# Patient Record
Sex: Male | Born: 1960
Health system: Southern US, Community
[De-identification: ages and names within clinical notes are randomized; demographics above are authoritative.]

## PROBLEM LIST (undated history)

## (undated) DIAGNOSIS — G4733 Obstructive sleep apnea (adult) (pediatric): Secondary | ICD-10-CM

## (undated) DIAGNOSIS — K219 Gastro-esophageal reflux disease without esophagitis: Secondary | ICD-10-CM

## (undated) DIAGNOSIS — M549 Dorsalgia, unspecified: Secondary | ICD-10-CM

## (undated) DIAGNOSIS — R1310 Dysphagia, unspecified: Secondary | ICD-10-CM

## (undated) HISTORY — DX: Dysphagia, unspecified: R13.10

## (undated) HISTORY — DX: Dorsalgia, unspecified: M54.9

## (undated) HISTORY — DX: Gastro-esophageal reflux disease without esophagitis: K21.9

## (undated) HISTORY — DX: Obstructive sleep apnea (adult) (pediatric): G47.33

---

## 2002-07-29 ENCOUNTER — Encounter: Payer: Self-pay | Admitting: Internal Medicine

## 2002-07-29 ENCOUNTER — Ambulatory Visit (HOSPITAL_COMMUNITY): Admission: RE | Admit: 2002-07-29 | Discharge: 2002-07-29 | Payer: Self-pay | Admitting: Internal Medicine

## 2002-08-22 ENCOUNTER — Ambulatory Visit (HOSPITAL_COMMUNITY): Admission: RE | Admit: 2002-08-22 | Discharge: 2002-08-22 | Payer: Self-pay | Admitting: Internal Medicine

## 2008-10-23 ENCOUNTER — Emergency Department (HOSPITAL_COMMUNITY): Admission: EM | Admit: 2008-10-23 | Discharge: 2008-10-23 | Payer: Self-pay | Admitting: Emergency Medicine
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
## 2011-01-30 LAB — POCT I-STAT, CHEM 8
Calcium, Ion: 1.1 mmol/L — ABNORMAL LOW (ref 1.12–1.32)
Chloride: 101 mEq/L (ref 96–112)
Creatinine, Ser: 1.4 mg/dL (ref 0.4–1.5)
Glucose, Bld: 126 mg/dL — ABNORMAL HIGH (ref 70–99)
HCT: 49 % (ref 39.0–52.0)
Hemoglobin: 16.7 g/dL (ref 13.0–17.0)
Potassium: 3.8 mEq/L (ref 3.5–5.1)

## 2011-01-30 LAB — CBC
Hemoglobin: 16.1 g/dL (ref 13.0–17.0)
MCHC: 34.2 g/dL (ref 30.0–36.0)
MCV: 95 fL (ref 78.0–100.0)
RBC: 4.97 MIL/uL (ref 4.22–5.81)
WBC: 11.8 10*3/uL — ABNORMAL HIGH (ref 4.0–10.5)

## 2011-01-30 LAB — DIFFERENTIAL
Basophils Relative: 0 % (ref 0–1)
Eosinophils Absolute: 0 10*3/uL (ref 0.0–0.7)
Lymphs Abs: 0.7 10*3/uL (ref 0.7–4.0)
Monocytes Absolute: 0.5 10*3/uL (ref 0.1–1.0)
Monocytes Relative: 5 % (ref 3–12)

## 2011-03-03 NOTE — Op Note (Signed)
NAME:  Dalton Bradley, Dalton Bradley                            ACCOUNT NO.:  [id number]   MEDICAL RECORD NO.:  [id number]                   PATIENT TYPE:  AMB   LOCATION:  DAY                                  FACILITY:  [facility]   PHYSICIAN:  R. Roetta Sessions, M.D.              DATE OF BIRTH:  28-Feb-1961   DATE OF PROCEDURE:  08/22/2002  DATE OF DISCHARGE:                                 OPERATIVE REPORT   PROCEDURE:  Diagnostic esophagogastroduodenoscopy.   ENDOSCOPIST:  Gerrit Friends. Rourk, M.D.   INDICATIONS FOR PROCEDURE:  The patient is a 50 year old gentleman with a 6-  month history of vague upper abdominal pain. He has some reflux symptoms. He  drinks carbonated/caffeinated beverages in excess daily. He has isolated,  elevated, STPT.  EGD is now being done to further evaluate his symptoms.  The approach has been discussed with the patient at length.  The potential  risks, benefits, and alternatives have been reviewed.  It is notable that  his CBC and amylase are normal.  Prior right upper quadrant ultrasound  demonstrated no hepatobiliary abnormalities. This approach has been  discussed with the patient previously. The potential risks, benefits, and  alternatives have been reviewed, questions answered. ASA I.   DESCRIPTION OF PROCEDURE:  O2 saturation, blood pressure, pulse and  respirations were monitored throughout the entire procedure.   CONSCIOUS SEDATION:  Versed 4 mg IV, Demerol 100 mg IV in divided doses,  Cetacaine spray for topical oropharyngeal anesthesia.   INSTRUMENT:  Olympus video chip adult gastroscope.   FINDINGS:  Examination of the tubular esophagus revealed multiple, deep,  distal linear esophageal erosions, or least 5 coming up from the  esophagogastric junction.  Please see photos.  There is no Barrett  esophagus.  No evidence of neoplasm.  There is a noncritical Schatzki's  ring.  The EG junction was easily traversed.   STOMACH:  The gastric cavity was empty.  It  insufflated well with air.  A  thorough examination of the gastric mucosa including a retroflex view of the  proximal stomach and esophagogastric junction demonstrated a small hiatal  hernia only.  The pylorus was patent and easily traversed.   DUODENUM:  The bulb and the second portion appeared normal.   THERAPY/DIAGNOSTIC MANEUVERS:  None.   The patient tolerated the procedure well and was reacted in endoscopy.   IMPRESSION:  1. Multiple, deep, distal esophageal erosions consistent with a moderately     severe erosive reflux esophagitis.  2. Small hiatal hernia.  3. The remainder of the upper gastrointestinal tract through the second     portion fo the duodenum appeared normal.   DISCUSSION:  This patient has a significant gastroesophageal reflux disease.   RECOMMENDATIONS:  1. Antireflux measures emphasized. He is to cut back dramatically on his     carbonated/caffeinated beverages.  2. Literature for gastroesophageal reflux provided to the patient.  3. Begin Aciphex 200 mg orally daily b.i.d. for the next 2 weeks (before     breakfast and supper) and then drop back to once daily before breakfast.     Follow up with me in 6 weeks.  4. Will repeat his liver functions just prior to his next office visit.                                               Jonathon Bellows, M.D.    RMR/MEDQ  D:  08/22/2002  T:  08/22/2002  Job:  [id number]   cc:   Kingsley Callander. Ouida Sills, M.D.  [address]  [place]  [place] [address]  Fax: [phone]

## 2011-03-03 NOTE — Consult Note (Signed)
NAMELAYNE, DILAURO                              ACCOUNT NO.:  [id number]   MEDICAL RECORD NO.:  [id number]                  PATIENT TYPE:   LOCATION:                                       FACILITY:   PHYSICIAN:  R. Roetta Sessions, M.D.              DATE OF BIRTH:  1961-06-17   DATE OF CONSULTATION:  DATE OF DISCHARGE:                                   CONSULTATION   REASON FOR CONSULTATION:  Elevated transaminases and upper abdominal pain.   HISTORY OF PRESENT ILLNESS:  This patient is a 50 year old Caucasian male  sent over by Dr. Ouida Sills to further evaluate a 86-month history of vague upper  abdominal pain.  He describes gnawing, burning epigastric and right upper  quadrant pain from time to time, not associated with eating or other  activities.  It comes and goes spontaneously. It may last a half hour to a  couple of hours.  There is nothing that he can do to incite it or ameliorate  these symptoms.  He does have some frequent background symptoms of heartburn  and also complains of a lump phlegm in the back of his throat which he  constantly has to attempt to clear.  He does not have hoarseness.  He does  take Advil fairly regularly for various aches and pains.  He has not had any  melena, hematochezia, no change in bowel habits.  No diarrhea or  constipation.  He moves his bowels at least once daily.  He has not had any  change in weight.  He does drink upwards of five to six 16-ounce carbonated  soft drinks daily.  Recent laboratory evaluation from 06/25/02 included a  liver profile which demonstrated elevated SGPT of 45, everything else was  normal.  Right upper quadrant ultrasound demonstrated no hepatobiliary  abnormalities.  He is not taking any herbal or other kind of remedies. There  is no family history of liver disease or cancer.  He does not consume any  alcohol.  No history of yellow jaundice or hepatitis previously.   PAST MEDICAL HISTORY:  Significant for no chronic  illnesses.   PAST SURGICAL HISTORY:  Past surgeries none.   CURRENT MEDICATIONS:  None.   ALLERGIES:  No known drug allergies.   FAMILY HISTORY:  The family reportedly in good health.  No history of  chronic GI or liver disease.   SOCIAL HISTORY:  The patient has been married for 3 months and has 2  children.  He is employed with [employer].  He describes this as being a  very stressful job.  No tobacco or alcohol.   REVIEW OF SYSTEMS:  No chest pain, no dyspnea.  No fever or chills.  No  change in weight.   PHYSICAL EXAMINATION:  GENERAL:  Reveals a pleasant, 50 year old gentleman  resting comfortably.  VITAL SIGNS:  Weight 231, height 6 feet 4.  Temperature 97.6, BP 110/80,  pulse 60.  SKIN:  Warm and dry.  No jaundice.  No cutaneous stigmata of chronic liver  disease.  HEENT:  No scleral icterus.  Conjunctivae are pink.  Oral cavity no lesions.  CHEST:  Lungs are clear to auscultation.  CARDIAC:  Regular rate and rhythm without murmur, gallop, or rub.  ABDOMEN:  Nondistended, positive bowel sounds.  Soft, nontender, without  appreciable mass or organomegaly.  EXTREMITIES:  No edema.   IMPRESSION:  This patient is a pleasant, 50 year old gentleman with a 6-  month history of vague upper abdominal pain which really falls into the  relatively nebulous category of dyspepsia.  He does take nonsteroidals.  He  has had GERD.  He does take carbonated/caffeinated beverages in excess  daily. His throat symptoms may or may not be related to any GI process.  He  has minimally elevated transaminases, normal right upper quadrant  ultrasound.   RECOMMENDATIONS:  We will proceed with an EGD to further evaluate his  abdominal pain.  I have discussed this approach with the patient.  The  potential risks, benefits, and alternatives have been reviewed, questions  answered.  I feel that is low risk, ASA I.   It has been a month since his LFTs were checked and we will go ahead and   plan to check a CBC, amylase, and repeat LFTs in the near future.  Further  recommendations to follow.   I would like to thank Dr. Ouida Sills for allowing me to see this nice gentleman  today.                                               Jonathon Bellows, M.D.    RMR/MEDQ  D:  08/13/2002  T:  08/14/2002  Job:  [id number]   cc:   Kingsley Callander. Ouida Sills, M.D.  [address]  [place]  [place] [address]  Fax: [phone]

## 2011-05-03 ENCOUNTER — Encounter: Payer: Self-pay | Admitting: Family Medicine

## 2014-06-09 ENCOUNTER — Encounter: Payer: Self-pay | Admitting: *Deleted

## 2014-07-03 ENCOUNTER — Ambulatory Visit (INDEPENDENT_AMBULATORY_CARE_PROVIDER_SITE_OTHER): Payer: 59 | Admitting: Family Medicine

## 2014-07-03 ENCOUNTER — Encounter: Payer: Self-pay | Admitting: Family Medicine

## 2014-07-03 VITALS — BP 110/72 | HR 74 | Temp 98.2°F | Resp 18 | Ht 75.0 in | Wt 234.0 lb

## 2014-07-03 DIAGNOSIS — Z0000 Encounter for general adult medical examination without abnormal findings: Secondary | ICD-10-CM
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
## 2014-07-06 ENCOUNTER — Other Ambulatory Visit: Payer: Self-pay | Admitting: Family Medicine

## 2014-07-06 DIAGNOSIS — Z1211 Encounter for screening for malignant neoplasm of colon: Secondary | ICD-10-CM

## 2014-07-07 ENCOUNTER — Other Ambulatory Visit: Payer: 59

## 2014-07-07 ENCOUNTER — Encounter: Payer: Self-pay | Admitting: Family Medicine

## 2014-07-07 DIAGNOSIS — Z0000 Encounter for general adult medical examination without abnormal findings: Secondary | ICD-10-CM

## 2014-07-07 LAB — COMPLETE METABOLIC PANEL WITH GFR
ALT: 33 U/L (ref 0–53)
AST: 22 U/L (ref 0–37)
Albumin: 4.4 g/dL (ref 3.5–5.2)
Alkaline Phosphatase: 89 U/L (ref 39–117)
BUN: 13 mg/dL (ref 6–23)
CALCIUM: 9.4 mg/dL (ref 8.4–10.5)
CHLORIDE: 102 meq/L (ref 96–112)
CO2: 30 meq/L (ref 19–32)
Creat: 1.09 mg/dL (ref 0.50–1.35)
GFR, EST AFRICAN AMERICAN: 89 mL/min
GFR, Est Non African American: 77 mL/min
Glucose, Bld: 92 mg/dL (ref 70–99)
POTASSIUM: 4.3 meq/L (ref 3.5–5.3)
SODIUM: 139 meq/L (ref 135–145)
TOTAL PROTEIN: 7.3 g/dL (ref 6.0–8.3)
Total Bilirubin: 0.7 mg/dL (ref 0.2–1.2)

## 2014-07-07 LAB — LIPID PANEL
Cholesterol: 168 mg/dL (ref 0–200)
HDL: 44 mg/dL (ref >39)
LDL CALC: 109 mg/dL — AB (ref 0–99)
Total CHOL/HDL Ratio: 3.8 Ratio
Triglycerides: 73 mg/dL (ref <150)
VLDL: 15 mg/dL (ref 0–40)

## 2014-07-07 LAB — URINALYSIS, ROUTINE W REFLEX MICROSCOPIC
Bilirubin Urine: NEGATIVE
Glucose, UA: NEGATIVE mg/dL
Hgb urine dipstick: NEGATIVE
KETONES UR: NEGATIVE mg/dL
Leukocytes, UA: NEGATIVE
NITRITE: NEGATIVE
PROTEIN: NEGATIVE mg/dL
SPECIFIC GRAVITY, URINE: 1.027 (ref 1.005–1.030)
UROBILINOGEN UA: 1 mg/dL (ref 0.0–1.0)
pH: 6.5 (ref 5.0–8.0)

## 2014-07-07 LAB — CBC WITH DIFFERENTIAL/PLATELET
Basophils Absolute: 0 10*3/uL (ref 0.0–0.1)
Basophils Relative: 1 % (ref 0–1)
Eosinophils Absolute: 0.2 10*3/uL (ref 0.0–0.7)
Eosinophils Relative: 4 % (ref 0–5)
HCT: 46.4 % (ref 39.0–52.0)
Hemoglobin: 15.6 g/dL (ref 13.0–17.0)
LYMPHS ABS: 1.1 10*3/uL (ref 0.7–4.0)
LYMPHS PCT: 24 % (ref 12–46)
MCH: 31.1 pg (ref 26.0–34.0)
MCHC: 33.6 g/dL (ref 30.0–36.0)
MCV: 92.6 fL (ref 78.0–100.0)
Monocytes Absolute: 0.6 10*3/uL (ref 0.1–1.0)
Monocytes Relative: 13 % — ABNORMAL HIGH (ref 3–12)
NEUTROS PCT: 58 % (ref 43–77)
Neutro Abs: 2.6 10*3/uL (ref 1.7–7.7)
PLATELETS: 186 10*3/uL (ref 150–400)
RBC: 5.01 MIL/uL (ref 4.22–5.81)
RDW: 13.4 % (ref 11.5–15.5)
WBC: 4.4 10*3/uL (ref 4.0–10.5)

## 2014-07-08 LAB — PSA: PSA: 0.9 ng/mL (ref <=4.00)

## 2014-07-10 ENCOUNTER — Telehealth: Payer: Self-pay | Admitting: Family Medicine

## 2014-07-10 DIAGNOSIS — Z1211 Encounter for screening for malignant neoplasm of colon: Secondary | ICD-10-CM

## 2014-07-10 NOTE — Telephone Encounter (Signed)
Pt called with lab results.  Referral for GI put in

## 2014-07-10 NOTE — Telephone Encounter (Signed)
Patient is calling to get lab results  [phone]

## 2014-07-10 NOTE — Telephone Encounter (Signed)
Message copied by Donne Anon on Fri Jul 10, 2014 12:38 PM ------      Message from: Lynnea Ferrier      Created: Thu Jul 09, 2014  7:25 AM       Labs are excellent.  LDL is 109 (good).  There is NO blood in his urine.  I would recommend colonosocpy. ------

## 2015-03-25 ENCOUNTER — Ambulatory Visit (INDEPENDENT_AMBULATORY_CARE_PROVIDER_SITE_OTHER): Payer: 59 | Admitting: Family Medicine

## 2015-03-25 ENCOUNTER — Encounter: Payer: Self-pay | Admitting: Family Medicine

## 2015-03-25 VITALS — BP 140/72 | HR 82 | Temp 98.4°F | Resp 18 | Ht 75.0 in | Wt 246.0 lb

## 2015-03-25 DIAGNOSIS — M5442 Lumbago with sciatica, left side: Secondary | ICD-10-CM

## 2015-03-25 LAB — URINALYSIS, ROUTINE W REFLEX MICROSCOPIC
Bilirubin Urine: NEGATIVE
Glucose, UA: NEGATIVE mg/dL
Hgb urine dipstick: NEGATIVE
KETONES UR: NEGATIVE mg/dL
Leukocytes, UA: NEGATIVE
Nitrite: NEGATIVE
PH: 6 (ref 5.0–8.0)
PROTEIN: NEGATIVE mg/dL
Specific Gravity, Urine: 1.01 (ref 1.005–1.030)
UROBILINOGEN UA: 1 mg/dL (ref 0.0–1.0)

## 2015-03-25 MED ORDER — CYCLOBENZAPRINE HCL 10 MG PO TABS: 10.0000 mg | ORAL_TABLET | Freq: Three times a day (TID) | ORAL | Status: DC | PRN

## 2015-03-25 MED ORDER — PREDNISONE 20 MG PO TABS: ORAL_TABLET | ORAL | Status: DC
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
## 2015-11-30 ENCOUNTER — Encounter: Payer: Self-pay | Admitting: Family Medicine

## 2017-03-27 ENCOUNTER — Ambulatory Visit (INDEPENDENT_AMBULATORY_CARE_PROVIDER_SITE_OTHER): Payer: 59 | Admitting: Family Medicine

## 2017-03-27 ENCOUNTER — Encounter: Payer: Self-pay | Admitting: Family Medicine

## 2017-03-27 VITALS — BP 112/78 | HR 62 | Temp 98.0°F | Resp 16 | Ht 75.0 in | Wt 249.0 lb

## 2017-03-27 DIAGNOSIS — F419 Anxiety disorder, unspecified: Secondary | ICD-10-CM | POA: Diagnosis not present

## 2017-03-27 DIAGNOSIS — R0681 Apnea, not elsewhere classified: Secondary | ICD-10-CM

## 2017-03-27 DIAGNOSIS — J019 Acute sinusitis, unspecified: Secondary | ICD-10-CM | POA: Diagnosis not present

## 2017-03-27 DIAGNOSIS — H6982 Other specified disorders of Eustachian tube, left ear: Secondary | ICD-10-CM | POA: Diagnosis not present

## 2017-03-27 DIAGNOSIS — M545 Low back pain, unspecified: Secondary | ICD-10-CM

## 2017-03-27 MED ORDER — NAPROXEN 500 MG PO TABS: 500.0000 mg | ORAL_TABLET | Freq: Two times a day (BID) | ORAL | 0 refills | Status: DC | PRN

## 2017-03-27 MED ORDER — FLUTICASONE PROPIONATE 50 MCG/ACT NA SUSP: 2.0000 | Freq: Every day | NASAL | 6 refills | Status: DC

## 2017-03-27 MED ORDER — ALPRAZOLAM 0.5 MG PO TABS: 0.5000 mg | ORAL_TABLET | Freq: Three times a day (TID) | ORAL | 0 refills | Status: DC | PRN
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
## 2017-04-24 ENCOUNTER — Encounter: Payer: Self-pay | Admitting: Family Medicine

## 2017-04-24 ENCOUNTER — Ambulatory Visit (INDEPENDENT_AMBULATORY_CARE_PROVIDER_SITE_OTHER): Payer: 59 | Admitting: Family Medicine

## 2017-04-24 VITALS — BP 140/82 | HR 78 | Temp 97.7°F | Resp 18 | Ht 75.0 in | Wt 252.0 lb

## 2017-04-24 DIAGNOSIS — R1310 Dysphagia, unspecified: Secondary | ICD-10-CM

## 2017-04-24 DIAGNOSIS — F419 Anxiety disorder, unspecified: Secondary | ICD-10-CM | POA: Diagnosis not present

## 2017-04-24 DIAGNOSIS — R09A2 Foreign body sensation, throat: Secondary | ICD-10-CM

## 2017-04-24 DIAGNOSIS — F458 Other somatoform disorders: Secondary | ICD-10-CM

## 2017-04-24 DIAGNOSIS — R0989 Other specified symptoms and signs involving the circulatory and respiratory systems: Secondary | ICD-10-CM

## 2017-04-24 MED ORDER — ALPRAZOLAM 0.5 MG PO TABS: 0.5000 mg | ORAL_TABLET | Freq: Three times a day (TID) | ORAL | 0 refills | Status: DC | PRN
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
## 2017-04-27 ENCOUNTER — Telehealth: Payer: Self-pay | Admitting: *Deleted

## 2017-04-27 ENCOUNTER — Encounter: Payer: Self-pay | Admitting: Family Medicine

## 2017-04-27 NOTE — Telephone Encounter (Signed)
Received fax from patient for clarification of FMLA forms. Per fax from [place], HR with [employer], the initial certification does not indicate what the treatment plan was for the condition, the care provider does not indicate that the patient was under the care of MD during the period of absence. This can be completed on letterhead and faxed back to HR.  Call placed to patient for more information.   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 Patient is requesting block for above date, and intermittent going forward for pain. Patient brought in original forms and provider completed in office. No copy noted in chart.   Verbalized that fee may be charged and is per provider prerogative.   Forms routed to provider.

## 2017-04-27 NOTE — Telephone Encounter (Signed)
Letter transcribed and faxed to [employer] HR.

## 2017-05-02 ENCOUNTER — Encounter (INDEPENDENT_AMBULATORY_CARE_PROVIDER_SITE_OTHER): Payer: Self-pay | Admitting: Internal Medicine

## 2017-05-14 ENCOUNTER — Ambulatory Visit (INDEPENDENT_AMBULATORY_CARE_PROVIDER_SITE_OTHER): Payer: 59 | Admitting: Neurology

## 2017-05-14 ENCOUNTER — Encounter: Payer: Self-pay | Admitting: Neurology

## 2017-05-14 VITALS — BP 146/90 | HR 65 | Ht 75.0 in | Wt 253.0 lb

## 2017-05-14 DIAGNOSIS — F419 Anxiety disorder, unspecified: Secondary | ICD-10-CM

## 2017-05-14 DIAGNOSIS — R351 Nocturia: Secondary | ICD-10-CM | POA: Diagnosis not present

## 2017-05-14 DIAGNOSIS — R0681 Apnea, not elsewhere classified: Secondary | ICD-10-CM

## 2017-05-14 DIAGNOSIS — R609 Edema, unspecified: Secondary | ICD-10-CM | POA: Diagnosis not present

## 2017-05-14 DIAGNOSIS — G4726 Circadian rhythm sleep disorder, shift work type: Secondary | ICD-10-CM

## 2017-05-14 DIAGNOSIS — R0683 Snoring: Secondary | ICD-10-CM | POA: Diagnosis not present

## 2017-05-14 DIAGNOSIS — K219 Gastro-esophageal reflux disease without esophagitis: Secondary | ICD-10-CM

## 2017-05-14 DIAGNOSIS — G2581 Restless legs syndrome: Secondary | ICD-10-CM | POA: Diagnosis not present

## 2017-05-14 DIAGNOSIS — G4761 Periodic limb movement disorder: Secondary | ICD-10-CM

## 2017-05-14 DIAGNOSIS — E669 Obesity, unspecified: Secondary | ICD-10-CM
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
## 2017-05-14 NOTE — Patient Instructions (Signed)

## 2017-05-15 ENCOUNTER — Ambulatory Visit (INDEPENDENT_AMBULATORY_CARE_PROVIDER_SITE_OTHER): Payer: Self-pay | Admitting: Internal Medicine

## 2017-05-31 ENCOUNTER — Encounter (INDEPENDENT_AMBULATORY_CARE_PROVIDER_SITE_OTHER): Payer: Self-pay | Admitting: Internal Medicine

## 2017-05-31 ENCOUNTER — Other Ambulatory Visit (INDEPENDENT_AMBULATORY_CARE_PROVIDER_SITE_OTHER): Payer: Self-pay | Admitting: Internal Medicine

## 2017-05-31 ENCOUNTER — Encounter (INDEPENDENT_AMBULATORY_CARE_PROVIDER_SITE_OTHER): Payer: Self-pay

## 2017-05-31 ENCOUNTER — Encounter (INDEPENDENT_AMBULATORY_CARE_PROVIDER_SITE_OTHER): Payer: Self-pay | Admitting: *Deleted

## 2017-05-31 ENCOUNTER — Ambulatory Visit (INDEPENDENT_AMBULATORY_CARE_PROVIDER_SITE_OTHER): Payer: 59 | Admitting: Internal Medicine

## 2017-05-31 DIAGNOSIS — K219 Gastro-esophageal reflux disease without esophagitis: Secondary | ICD-10-CM | POA: Diagnosis not present

## 2017-05-31 DIAGNOSIS — R1310 Dysphagia, unspecified: Secondary | ICD-10-CM | POA: Diagnosis not present

## 2017-05-31 DIAGNOSIS — R1319 Other dysphagia: Secondary | ICD-10-CM | POA: Insufficient documentation

## 2017-05-31 DIAGNOSIS — M549 Dorsalgia, unspecified: Secondary | ICD-10-CM

## 2017-05-31 HISTORY — DX: Dorsalgia, unspecified: M54.9

## 2017-05-31 HISTORY — DX: Dysphagia, unspecified: R13.10

## 2017-05-31 HISTORY — DX: Gastro-esophageal reflux disease without esophagitis: K21.9

## 2017-05-31 MED ORDER — DEXLANSOPRAZOLE 60 MG PO CPDR: 60.0000 mg | DELAYED_RELEASE_CAPSULE | Freq: Every day | ORAL | 3 refills | Status: DC

## 2017-05-31 NOTE — Patient Instructions (Signed)
EGD/ED. The risks of bleeding, perforation and infection were reviewed with patient.  
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
## 2017-06-19 ENCOUNTER — Ambulatory Visit (INDEPENDENT_AMBULATORY_CARE_PROVIDER_SITE_OTHER): Payer: 59 | Admitting: Neurology

## 2017-06-19 DIAGNOSIS — G4761 Periodic limb movement disorder: Secondary | ICD-10-CM

## 2017-06-19 DIAGNOSIS — G4733 Obstructive sleep apnea (adult) (pediatric): Secondary | ICD-10-CM | POA: Diagnosis not present

## 2017-06-20 NOTE — Progress Notes (Signed)
  Patient referred by Dr. Tanya NonesPickard, seen by me on 05/14/17, split study on 06/19/17. Please call and notify patient that the recent sleep study confirmed the diagnosis of severe OSA. He did very well with CPAP during the study with significant improvement of the respiratory events. Therefore, I would like start the patient on CPAP therapy at home by prescribing a machine for home use. I placed the order in the chart. The patient will need a follow up appointment with me in 10 weeks post set up that has to be scheduled; may see one of the NPs if needed for timing. Please go ahead and schedule while you have the patient on the phone and make sure patient understands the importance of keeping this window for the FU appointment, as it is often an insurance requirement and failing to adhere to this may result in losing coverage for sleep apnea treatment.  Please re-enforce the importance of compliance with treatment and the need for us to monitor compliance data - again an insurance requirement and good feedback for the patient as far as how they are doing.  Also remind patient, that any upcoming CPAP machine or mask issues, should be first addressed with the DME company. Please ask if patient has a preference regarding DME company.  Please arrange for CPAP set up at home through a DME company of patient's choice - once you have spoken to the patient - and faxed/routed report to PCP and referring MD (if other than PCP), you can close this encounter, thanks,   Huston FoleySaima Taura Lamarre, MD, PhD [facility] ([facility])

## 2017-06-20 NOTE — Procedures (Signed)
PATIENT'S NAME:  Dalton Bradley, Dalton Bradley DOB:      06/16/1961      MR#:    [id number]     DATE OF RECORDING: 06/19/2017 REFERRING M.D.:  Lynnea FerrierWarren Pickard, MD Study Performed:  Split-Night Titration Study HISTORY: 56 year old man with a history of anxiety disorder, low back pain, reflux disease, allergic rhinitis and obesity, who reports snoring and excessive daytime somnolence as well as witnessed apneic pauses while asleep per wife's report. The patient endorsed the Epworth Sleepiness Scale at 8/24 points. The patient's weight 253 pounds with a height of 75 (inches), resulting in a BMI of 31.5 kg/m2. The patient's neck circumference measured 17 inches.  CURRENT MEDICATIONS: Alprazolam, Fluticasone, Ibuprofen and Naproxen  PROCEDURE:  This is a multichannel digital polysomnogram utilizing the Somnostar 11.2 system.  Electrodes and sensors were applied and monitored per AASM Specifications.   EEG, EOG, Chin and Limb EMG, were sampled at 200 Hz.  ECG, Snore and Nasal Pressure, Thermal Airflow, Respiratory Effort, CPAP Flow and Pressure, Oximetry was sampled at 50 Hz. Digital video and audio were recorded.      BASELINE STUDY WITHOUT CPAP RESULTS:  Lights Out was at 21:21 and Lights On at 04:59 for the night, split start at 01:33, epoch 511. Total recording time (TRT) was 251, with a total sleep time (TST) of 214.5 minutes.   The patient's sleep latency was 28 minutes. REM latency was 98 minutes.  The sleep efficiency was 85.5 %.    SLEEP ARCHITECTURE: WASO (Wake after sleep onset) was 10.5 minutes, Stage N1 was 18 minutes, Stage N2 was 164.5 minutes, Stage N3 was 0 minutes and Stage R (REM sleep) was 32 minutes. The percentages were Stage N1 8.4%, Stage N2 76.7%, Stage N3 was absent and Stage R (REM sleep) 14.9%.  The arousals were noted as: 7 were spontaneous, 11 were associated with PLMs, 144 were associated with respiratory events.   Audio and video analysis did not show any abnormal or unusual movements,  behaviors, phonations or vocalizations. The patient took 1 bathroom break. Moderate to loud snoring was noted. The EKG was in keeping with normal sinus rhythm (NSR).   RESPIRATORY ANALYSIS:  There were a total of 144 respiratory events:  68 obstructive apneas, 0 central apneas and 0 mixed apneas with a total of 68 apneas and an apnea index (AI) of 19.. There were 76 hypopneas with a hypopnea index of 21.3. The patient also had 0 respiratory event related arousals (RERAs).  Snoring was noted.     The total APNEA/HYPOPNEA INDEX (AHI) was 40.3 /hour and the total RESPIRATORY DISTURBANCE INDEX was 40.3 /hour.  8 events occurred in REM sleep and 138 events in NREM. The REM AHI was 15, /hour versus a non-REM AHI of 44.7 /hour. The patient spent 209 minutes sleep time in the supine position 181 minutes in non-supine. The supine AHI was 64.6 /hour versus a non-supine AHI of 10.5 /hour.  OXYGEN SATURATION & C02:  The wake baseline 02 saturation was 96%, with the lowest being 76%. Time spent below 89% saturation equaled 60 minutes.  PERIODIC LIMB MOVEMENTS: The patient had a total of 44 Periodic Limb Movements.  The Periodic Limb Movement (PLM) index was 12.3 /hour and the PLM Arousal index was 3.1 /hour.  TITRATION STUDY WITH CPAP RESULTS:   The patient was fitted with medium nasal pillows. CPAP was initiated at 5 cmH20 with heated humidity per AASM split night standards and pressure was advanced to 9 cmH20 because of  hypopneas, apneas and desaturations.  At a PAP pressure of 8 cmH20, there was a reduction of the AHI to 0/hour, O2 nadir of 92%, non-supine REM sleep achieved.   Total recording time (TRT) was 207.5 minutes, with a total sleep time (TST) of 175.5 minutes. The patient's sleep latency was 18 minutes. REM latency was 44 minutes.  The sleep efficiency was 84.6 %.    SLEEP ARCHITECTURE: Wake after sleep was 25 minutes, Stage N1 13 minutes, Stage N2 128 minutes, Stage N3 0 minutes and Stage R (REM  sleep) 34.5 minutes. The percentages were: Stage N1 7.4%, Stage N2 72.9%, Stage N3 was absent and Stage R (REM sleep) 19.7%. The arousals were noted as: 11 were spontaneous, 27 were associated with PLMs, 1 were associated with respiratory events.  RESPIRATORY ANALYSIS:  There were a total of 0 respiratory events: 0 obstructive apneas, 0 central apneas and 0 mixed apneas with a total of 0 apneas and an apnea index (AI) of 0. There were 0 hypopneas with a hypopnea index of 0 /hour. The patient also had 0 respiratory event related arousals (RERAs).      The total APNEA/HYPOPNEA INDEX  (AHI) was 0/hour and the total RESPIRATORY DISTURBANCE INDEX was 0 /hour.  0 events occurred in REM sleep and 0 events in NREM. The REM AHI was 0 /hour versus a non-REM AHI of 0 /hour. REM sleep was achieved on a pressure of  cm/h2o (AHI was  .) The patient spent 52% of total sleep time in the supine position. The supine AHI was 0.0 /hour, versus a non-supine AHI of 0.0/hour.  OXYGEN SATURATION & C02:  The wake baseline 02 saturation was 94%, with the lowest being 91%. Time spent below 89% saturation equaled 0 minutes.  PERIODIC LIMB MOVEMENTS: The patient had a total of 145 Periodic Limb Movements. The Periodic Limb Movement (PLM) index was 49.6 /hour and the PLM Arousal index was 9.2 /hour.  POLYSOMNOGRAPHY IMPRESSION :  1. Obstructive Sleep Apnea (OSA)  2. Periodic Limb Movement Disorder (PLMD)  RECOMMENDATIONS: 1. This patient has severe obstructive sleep apnea and responded well on CPAP therapy. I will, therefore, start the patient on home CPAP treatment at a pressure of 8 cm via medium nasal pillows with heated humidity. The patient should be reminded to be fully compliant with PAP therapy to improve sleep related symptoms and decrease long term cardiovascular risks. Please note that untreated obstructive sleep apnea carries additional perioperative morbidity. Patients with significant obstructive sleep apnea should  receive perioperative PAP therapy and the surgeons and particularly the anesthesiologist should be informed of the diagnosis and the severity of the sleep disordered breathing. 2. Moderate PLMs (periodic limb movements of sleep) were noted during the treatment portion of the study only and with mild arousals; this could reflect adjustment to PAP therepy. Clinical correlation with RLS symptoms is recommended.  3. The patient should be cautioned not to drive, work at heights, or operate dangerous or heavy equipment when tired or sleepy. Review and reiteration of good sleep hygiene measures should be pursued with any patient. 4. The patient will be seen in follow-up by Dr. Frances Furbish at [facility] for discussion of the test results and further management strategies. The referring provider will be notified of the test results.  I certify that I have reviewed the entire raw data recording prior to the issuance of this report in accordance with the Standards of Accreditation of the American Academy of Sleep Medicine (AASM)   Huston Foley,  MD, PhD Diplomat, American Board of Psychiatry and Neurology (Neurology and Sleep Medicine)

## 2017-06-20 NOTE — Addendum Note (Signed)
Addended by: Huston FoleyATHAR, Rosabella Edgin on: 06/20/2017 07:39 AM   Modules accepted: Orders

## 2017-06-21 ENCOUNTER — Encounter: Payer: Self-pay | Admitting: Family Medicine

## 2017-06-21 ENCOUNTER — Telehealth: Payer: Self-pay

## 2017-06-21 DIAGNOSIS — G4733 Obstructive sleep apnea (adult) (pediatric): Secondary | ICD-10-CM | POA: Insufficient documentation

## 2017-06-21 NOTE — Telephone Encounter (Signed)
I called pt to discuss his sleep study results. No answer, left a message asking him to call me back. 

## 2017-06-21 NOTE — Telephone Encounter (Signed)
-----   Message from Huston FoleySaima Athar, MD sent at 06/20/2017  7:39 AM EDT -----  Patient referred by Dr. Tanya NonesPickard, seen by me on 05/14/17, split study on 06/19/17. Please call and notify patient that the recent sleep study confirmed the diagnosis of severe OSA. He did very well with CPAP during the study with significant improvement of the respiratory events. Therefore, I would like start the patient on CPAP therapy at home by prescribing a machine for home use. I placed the order in the chart. The patient will need a follow up appointment with me in 10 weeks post set up that has to be scheduled; may see one of the NPs if needed for timing. Please go ahead and schedule while you have the patient on the phone and make sure patient understands the importance of keeping this window for the FU appointment, as it is often an insurance requirement and failing to adhere to this may result in losing coverage for sleep apnea treatment.  Please re-enforce the importance of compliance with treatment and the need for us to monitor compliance data - again an insurance requirement and good feedback for the patient as far as how they are doing.  Also remind patient, that any upcoming CPAP machine or mask issues, should be first addressed with the DME company. Please ask if patient has a preference regarding DME company.  Please arrange for CPAP set up at home through a DME company of patient's choice - once you have spoken to the patient - and faxed/routed report to PCP and referring MD (if other than PCP), you can close this encounter, thanks,   Huston FoleySaima Athar, MD, PhD [facility] ([facility])

## 2017-06-21 NOTE — Telephone Encounter (Signed)
Patient called office returning RN's call patient is at work until 7:30 pm and would like to have a call back after 8:00am on Monday.

## 2017-06-25 NOTE — Telephone Encounter (Signed)
I called pt. I advised pt that Dr. Frances FurbishAthar reviewed their sleep study results and found that pt has severe osa but did well with the cpap during his sleep study. Dr. Frances FurbishAthar recommends that pt start a cpap at home. I reviewed PAP compliance expectations with the pt. Pt is agreeable to starting a CPAP. I advised pt that an order will be sent to a DME, [facility], and [facility] will call the pt within about one week after they file with the pt's insurance. [facility] will show the pt how to use the machine, fit for masks, and troubleshoot the CPAP if needed. A follow up appt was made for insurance purposes with Dr. Frances FurbishAthar on 08/28/2017 at 2:00pm. Pt verbalized understanding to arrive 15 minutes early and bring their CPAP. A letter with all of this information in it will be mailed to the pt as a reminder. I verified with the pt that the address we have on file is correct. Pt verbalized understanding of results. Pt had no questions at this time but was encouraged to call back if questions arise.

## 2017-06-28 ENCOUNTER — Encounter (INDEPENDENT_AMBULATORY_CARE_PROVIDER_SITE_OTHER): Payer: Self-pay | Admitting: Internal Medicine

## 2017-06-28 ENCOUNTER — Ambulatory Visit (HOSPITAL_COMMUNITY)
Admission: RE | Admit: 2017-06-28 | Discharge: 2017-06-28 | Disposition: A | Discharge status: Home / Self Care | Payer: 59 | Source: Ambulatory Visit | Attending: Internal Medicine | Admitting: Internal Medicine

## 2017-06-28 ENCOUNTER — Encounter (HOSPITAL_COMMUNITY)
Admission: RE | Disposition: A | Discharge status: Home / Self Care | Payer: Self-pay | Source: Ambulatory Visit | Attending: Internal Medicine

## 2017-06-28 ENCOUNTER — Encounter (HOSPITAL_COMMUNITY): Payer: Self-pay | Admitting: *Deleted

## 2017-06-28 DIAGNOSIS — G4733 Obstructive sleep apnea (adult) (pediatric): Secondary | ICD-10-CM | POA: Diagnosis not present

## 2017-06-28 DIAGNOSIS — K449 Diaphragmatic hernia without obstruction or gangrene: Secondary | ICD-10-CM | POA: Insufficient documentation

## 2017-06-28 DIAGNOSIS — R1319 Other dysphagia: Secondary | ICD-10-CM | POA: Insufficient documentation

## 2017-06-28 DIAGNOSIS — K210 Gastro-esophageal reflux disease with esophagitis: Secondary | ICD-10-CM

## 2017-06-28 DIAGNOSIS — R1310 Dysphagia, unspecified: Secondary | ICD-10-CM | POA: Diagnosis not present

## 2017-06-28 DIAGNOSIS — Z79899 Other long term (current) drug therapy: Secondary | ICD-10-CM | POA: Insufficient documentation

## 2017-06-28 DIAGNOSIS — K222 Esophageal obstruction: Secondary | ICD-10-CM | POA: Insufficient documentation

## 2017-06-28 DIAGNOSIS — K219 Gastro-esophageal reflux disease without esophagitis: Secondary | ICD-10-CM

## 2017-06-28 DIAGNOSIS — R1314 Dysphagia, pharyngoesophageal phase: Secondary | ICD-10-CM | POA: Diagnosis not present

## 2017-06-28 HISTORY — PX: ESOPHAGOGASTRODUODENOSCOPY: SHX5428

## 2017-06-28 HISTORY — PX: ESOPHAGEAL DILATION: SHX303

## 2017-06-28 SURGERY — EGD (ESOPHAGOGASTRODUODENOSCOPY)
Anesthesia: Moderate Sedation

## 2017-06-28 MED ORDER — MEPERIDINE HCL 50 MG/ML IJ SOLN
INTRAMUSCULAR | Status: DC | PRN
  Administered 2017-06-28 (×2): 25 mg via INTRAVENOUS

## 2017-06-28 MED ORDER — MEPERIDINE HCL 50 MG/ML IJ SOLN
INTRAMUSCULAR | Status: AC
  Filled 2017-06-28: qty 1

## 2017-06-28 MED ORDER — MIDAZOLAM HCL 5 MG/5ML IJ SOLN
INTRAMUSCULAR | Status: DC | PRN
  Administered 2017-06-28 (×4): 2 mg via INTRAVENOUS

## 2017-06-28 MED ORDER — FAMOTIDINE 20 MG PO TABS
20.0000 mg | ORAL_TABLET | Freq: Every day | ORAL | Status: DC
Start: 2017-06-28 — End: 2019-03-03

## 2017-06-28 MED ORDER — LIDOCAINE VISCOUS 2 % MT SOLN
OROMUCOSAL | Status: AC
  Filled 2017-06-28: qty 15

## 2017-06-28 MED ORDER — SODIUM CHLORIDE 0.9 % IV SOLN
INTRAVENOUS | Status: DC
  Administered 2017-06-28: 10:00:00 via INTRAVENOUS

## 2017-06-28 MED ORDER — STERILE WATER FOR IRRIGATION IR SOLN
Status: DC | PRN
  Administered 2017-06-28: 10:00:00

## 2017-06-28 MED ORDER — LIDOCAINE VISCOUS 2 % MT SOLN
OROMUCOSAL | Status: DC | PRN
  Administered 2017-06-28: 4 mL via OROMUCOSAL

## 2017-06-28 MED ORDER — MIDAZOLAM HCL 5 MG/5ML IJ SOLN
INTRAMUSCULAR | Status: AC
  Filled 2017-06-28: qty 10

## 2017-06-28 NOTE — Discharge Instructions (Signed)
Food Choices for Gastroesophageal Reflux Disease, Adult When you have gastroesophageal reflux disease (GERD), the foods you eat and your eating habits are very important. Choosing the right foods can help ease your discomfort. What guidelines do I need to follow?  Choose fruits, vegetables, whole grains, and low-fat dairy products.  Choose low-fat meat, fish, and poultry.  Limit fats such as oils, salad dressings, butter, nuts, and avocado.  Keep a food diary. This helps you identify foods that cause symptoms.  Avoid foods that cause symptoms. These may be different for everyone.  Eat small meals often instead of 3 large meals a day.  Eat your meals slowly, in a place where you are relaxed.  Limit fried foods.  Cook foods using methods other than frying.  Avoid drinking alcohol.  Avoid drinking large amounts of liquids with your meals.  Avoid bending over or lying down until 2-3 hours after eating. What foods are not recommended? These are some foods and drinks that may make your symptoms worse: Vegetables Tomatoes. Tomato juice. Tomato and spaghetti sauce. Chili peppers. Onion and garlic. Horseradish. Fruits Oranges, grapefruit, and lemon (fruit and juice). Meats High-fat meats, fish, and poultry. This includes hot dogs, ribs, ham, sausage, salami, and bacon. Dairy Whole milk and chocolate milk. Sour cream. Cream. Butter. Ice cream. Cream cheese. Drinks Coffee and tea. Bubbly (carbonated) drinks or energy drinks. Condiments Hot sauce. Barbecue sauce. Sweets/Desserts Chocolate and cocoa. Donuts. Peppermint and spearmint. Fats and Oils High-fat foods. This includes Jamaica[place] fries and potato chips. Other Vinegar. Strong spices. This includes black pepper, white pepper, red pepper, cayenne, curry powder, cloves, ginger, and chili powder. The items listed above may not be a complete list of foods and drinks to avoid. Contact your dietitian for more information. This  information is not intended to replace advice given to you by your health care provider. Make sure you discuss any questions you have with your health care provider. Document Released: 04/02/2012 Document Revised: 03/09/2016 Document Reviewed: 08/06/2013 Elsevier Interactive Patient Education  2017 [employer]    Resume usual medications and diet. Pepcid OTC 20 mg or Zantac OTC 150 mg by mouth daily at bedtime. Anti-reflux measures. No driving for 24 hours. Office visit in 3 months.
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
## 2017-06-28 NOTE — Op Note (Signed)
[facility] Patient Name: Dalton SabinsStan Blanke Procedure Date: 06/28/2017 9:50 AM MRN: [id number] Date of Birth: 11/25/1960 Attending MD: Lionel DecemberNajeeb Malak Duchesneau , MD CSN: [id number] Age: 56 Admit Type: Outpatient Procedure:                Upper GI endoscopy Indications:              Esophageal dysphagia, Reflux esophagitis Providers:                Lionel DecemberNajeeb Angelin Cutrone, MD, Loma MessingLurae B. Patsy LagerAlbert RN, RN, Edrick Kinsammy                            Vaught, RN Referring MD:             Priscille HeidelbergWarren T. Pickard, MD Medicines:                Lidocaine spray, Meperidine 50 mg IV, Midazolam 9                            mg IV Complications:            No immediate complications. Estimated Blood Loss:     Estimated blood loss was minimal. Procedure:                Pre-Anesthesia Assessment:                           - Prior to the procedure, a History and Physical                            was performed, and patient medications and                            allergies were reviewed. The patient's tolerance of                            previous anesthesia was also reviewed. The risks                            and benefits of the procedure and the sedation                            options and risks were discussed with the patient.                            All questions were answered, and informed consent                            was obtained. Prior Anticoagulants: The patient has                            taken no previous anticoagulant or antiplatelet                            agents. ASA Grade Assessment: II - A patient with  mild systemic disease. After reviewing the risks                            and benefits, the patient was deemed in                            satisfactory condition to undergo the procedure.                           After obtaining informed consent, the endoscope was                            passed under direct vision. Throughout the                            procedure, the  patient's blood pressure, pulse, and                            oxygen saturations were monitored continuously. The                            [id number] ([id number]) scope was introduced through the                            mouth, and advanced to the second part of duodenum.                            The upper GI endoscopy was accomplished without                            difficulty. The patient tolerated the procedure                            well. Scope In: 10:09:37 AM Scope Out: 10:18:35 AM Total Procedure Duration: 0 hours 8 minutes 58 seconds  Findings:      The proximal esophagus and mid esophagus were normal.      LA Grade B (one or more mucosal breaks greater than 5 mm, not extending       between the tops of two mucosal folds) esophagitis was found 37 to 39 cm       from the incisors.      The Z-line was irregular and was found 39 cm from the incisors.      One moderate benign-appearing, intrinsic stenosis was found 39 cm from       the incisors. This measured 1.3 cm (inner diameter) x less than one cm       (in length) and was traversed. A TTS dilator was passed through the       scope. Dilation with a 15-16.5-18 mm balloon dilator was performed to 15       mm, 16.5 mm and 18 mm. The dilation site was examined following       endoscope reinsertion and showed moderate improvement in luminal       narrowing and no perforation.      A 3 cm hiatal hernia was present.      The  entire examined stomach was normal.      The duodenal bulb and second portion of the duodenum were normal. Impression:               - Normal proximal esophagus and mid esophagus.                           - LA Grade B reflux esophagitis.                           - Z-line irregular, 39 cm from the incisors.                           - Benign-appearing esophageal stenosis at GEJ.                            Dilated.                           - 3 cm hiatal hernia.                           - Normal  stomach.                           - Normal duodenal bulb and second portion of the                            duodenum.                           - No specimens collected. Moderate Sedation:      Moderate (conscious) sedation was administered by the endoscopy nurse       and supervised by the endoscopist. The following parameters were       monitored: oxygen saturation, heart rate, blood pressure, CO2       capnography and response to care. Total physician intraservice time was       14 minutes. Recommendation:           - Patient has a contact number available for                            emergencies. The signs and symptoms of potential                            delayed complications were discussed with the                            patient. Return to normal activities tomorrow.                            Written discharge instructions were provided to the                            patient.                           -  Low sodium diet today.                           - Continue present medications.                           - Pepcid OTC 20 mg by mouth daily at bedtime.                           - Return to GI office in 3 months. Procedure Code(s):        --- Professional ---                           [phone], Esophagogastroduodenoscopy, flexible,                            transoral; with transendoscopic balloon dilation of                            esophagus (less than 30 mm diameter)                           99152, Moderate sedation services provided by the                            same physician or other qualified health care                            professional performing the diagnostic or                            therapeutic service that the sedation supports,                            requiring the presence of an independent trained                            observer to assist in the monitoring of the                            patient's level of consciousness and  physiological                            status; initial 15 minutes of intraservice time,                            patient age 41 years or older Diagnosis Code(s):        --- Professional ---                           K21.0, Gastro-esophageal reflux disease with                            esophagitis  K22.8, Other specified diseases of esophagus                           K22.2, Esophageal obstruction                           K44.9, Diaphragmatic hernia without obstruction or                            gangrene                           R13.14, Dysphagia, pharyngoesophageal phase CPT copyright 2016 American Medical Association. All rights reserved. The codes documented in this report are preliminary and upon coder review may  be revised to meet current compliance requirements. Lionel December, MD Lionel December, MD 06/28/2017 10:30:00 AM This report has been signed electronically. Number of Addenda: 0

## 2017-06-29 ENCOUNTER — Encounter: Payer: Self-pay | Admitting: Family Medicine

## 2017-07-02 ENCOUNTER — Encounter (HOSPITAL_COMMUNITY): Payer: Self-pay | Admitting: Internal Medicine

## 2017-07-25 DIAGNOSIS — G4733 Obstructive sleep apnea (adult) (pediatric): Secondary | ICD-10-CM | POA: Diagnosis not present

## 2017-07-31 ENCOUNTER — Encounter: Payer: Self-pay | Admitting: Family Medicine

## 2017-08-25 DIAGNOSIS — G4733 Obstructive sleep apnea (adult) (pediatric): Secondary | ICD-10-CM | POA: Diagnosis not present

## 2017-08-26 ENCOUNTER — Encounter: Payer: Self-pay | Admitting: Neurology

## 2017-08-28 ENCOUNTER — Encounter: Payer: Self-pay | Admitting: Neurology

## 2017-08-28 ENCOUNTER — Encounter (INDEPENDENT_AMBULATORY_CARE_PROVIDER_SITE_OTHER): Payer: Self-pay

## 2017-08-28 ENCOUNTER — Ambulatory Visit: Payer: 59 | Admitting: Neurology

## 2017-08-28 VITALS — BP 166/95 | HR 77 | Ht 75.0 in | Wt 252.0 lb

## 2017-08-28 DIAGNOSIS — G2581 Restless legs syndrome: Secondary | ICD-10-CM | POA: Diagnosis not present

## 2017-08-28 DIAGNOSIS — G4733 Obstructive sleep apnea (adult) (pediatric): Secondary | ICD-10-CM

## 2017-08-28 DIAGNOSIS — G4761 Periodic limb movement disorder: Secondary | ICD-10-CM

## 2017-08-28 DIAGNOSIS — Z9989 Dependence on other enabling machines and devices: Secondary | ICD-10-CM

## 2017-08-28 MED ORDER — GABAPENTIN 100 MG PO CAPS: 100.0000 mg | ORAL_CAPSULE | Freq: Every day | ORAL | 5 refills | Status: DC

## 2017-08-28 NOTE — Patient Instructions (Addendum)
Keep up the good work! I will see you back in 6 months for sleep apnea check up, and if you continue to do well on CPAP I will see you once a year thereafter.   Please continue using your CPAP regularly. While your insurance requires that you use CPAP at least 4 hours each night on 70% of the nights, I recommend, that you not skip any nights and use it throughout the night if you can. Getting used to CPAP and staying with the treatment long term does take time and patience and discipline. Untreated obstructive sleep apnea when it is moderate to severe can have an adverse impact on cardiovascular health and raise her risk for heart disease, arrhythmias, hypertension, congestive heart failure, stroke and diabetes. Untreated obstructive sleep apnea causes sleep disruption, nonrestorative sleep, and sleep deprivation. This can have an impact on your day to day functioning and cause daytime sleepiness and impairment of cognitive function, memory loss, mood disturbance, and problems focussing. Using CPAP regularly can improve these symptoms.  For your restless legs and leg movements, start Neurontin (gabapentin) 100 mg strength: Take 1 pill nightly at bedtime, that means before you go to sleep, according to your sleep schedule. The most common side effects reported are sedation or sleepiness. Rare side effects include balance problems, confusion.
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
## 2017-08-29 ENCOUNTER — Encounter: Payer: Self-pay | Admitting: Family Medicine

## 2017-09-24 DIAGNOSIS — G4733 Obstructive sleep apnea (adult) (pediatric): Secondary | ICD-10-CM | POA: Diagnosis not present

## 2017-09-27 ENCOUNTER — Ambulatory Visit (INDEPENDENT_AMBULATORY_CARE_PROVIDER_SITE_OTHER): Payer: 59 | Admitting: Internal Medicine

## 2017-10-25 DIAGNOSIS — G4733 Obstructive sleep apnea (adult) (pediatric): Secondary | ICD-10-CM | POA: Diagnosis not present

## 2017-11-01 ENCOUNTER — Encounter: Payer: Self-pay | Admitting: Podiatry

## 2017-11-01 ENCOUNTER — Ambulatory Visit (INDEPENDENT_AMBULATORY_CARE_PROVIDER_SITE_OTHER): Payer: 59

## 2017-11-01 ENCOUNTER — Ambulatory Visit: Payer: 59 | Admitting: Podiatry

## 2017-11-01 ENCOUNTER — Other Ambulatory Visit: Payer: Self-pay | Admitting: Podiatry

## 2017-11-01 DIAGNOSIS — M79672 Pain in left foot: Secondary | ICD-10-CM

## 2017-11-01 DIAGNOSIS — M2041 Other hammer toe(s) (acquired), right foot: Secondary | ICD-10-CM

## 2017-11-01 DIAGNOSIS — L600 Ingrowing nail: Secondary | ICD-10-CM | POA: Diagnosis not present

## 2017-11-01 DIAGNOSIS — M2042 Other hammer toe(s) (acquired), left foot: Secondary | ICD-10-CM

## 2017-11-01 DIAGNOSIS — M722 Plantar fascial fibromatosis: Secondary | ICD-10-CM | POA: Diagnosis not present

## 2017-11-01 DIAGNOSIS — M79671 Pain in right foot: Secondary | ICD-10-CM | POA: Diagnosis not present

## 2017-11-01 MED ORDER — TRIAMCINOLONE ACETONIDE 10 MG/ML IJ SUSP
10.0000 mg | Freq: Once | INTRAMUSCULAR | Status: AC
  Administered 2017-11-01: 10 mg

## 2017-11-01 NOTE — Progress Notes (Signed)
   Subjective:    Patient ID: Dalton Bradley, male    DOB: 09/08/1961, 57 y.o.   MRN: [id number]  HPI    Review of Systems  All other systems reviewed and are negative.      Objective:   Physical Exam        Assessment & Plan:

## 2017-11-01 NOTE — Progress Notes (Signed)
Subjective:   Patient ID: Dalton Bradley, male   DOB: 57 y.o.   MRN: [id number]   HPI Patient presents stating he had a lot of pain in his left heel in the outside of the foot and has significant hammertoe deformity left over right with prominence of the metatarsals.  His feet get tired and he does work on cement floors for long hours and it becomes increasingly hard for him to be ambulatory   Review of Systems  All other systems reviewed and are negative.       Objective:  Physical Exam  Constitutional: He appears well-developed and well-nourished.  Cardiovascular: Intact distal pulses.  Pulmonary/Chest: Effort normal.  Musculoskeletal: Normal range of motion.  Neurological: He is alert.  Skin: Skin is warm.  Nursing note and vitals reviewed.   Neurovascular status intact muscle strength adequate range of motion within normal limits with high arch foot structure with discomfort plantar heel left over right and inflammation of the peroneal insertion left.  Noted to have significant digital deformity left over right with prominent metatarsal head secondary to the rigid contracture noted.  Patient does have moderate equinus condition noted also     Assessment:  Cavus foot structure with pressure against the metatarsal significant structural digital deformities and also plantar fasciitis and tendinitis     Plan:  H&P all conditions reviewed and today I injected the plantar fascial left 3 mg Kenalog 5 mg Xylocaine and placed him into an orthotic to reduce all plantar pressures and try to get his weight more proper on his rear foot and forefoot.  I will see this patient back when ready and may require further treatment and we will reevaluate  X-rays indicate high arch foot type with large plantar spur formation and mild posterior spur formation bilateral

## 2017-11-01 NOTE — Patient Instructions (Addendum)
Plantar Fasciitis (Heel Spur Syndrome) with Rehab The plantar fascia is a fibrous, ligament-like, soft-tissue structure that spans the bottom of the foot. Plantar fasciitis is a condition that causes pain in the foot due to inflammation of the tissue. SYMPTOMS   Pain and tenderness on the underneath side of the foot.  Pain that worsens with standing or walking. CAUSES  Plantar fasciitis is caused by irritation and injury to the plantar fascia on the underneath side of the foot. Common mechanisms of injury include:  Direct trauma to bottom of the foot.  Damage to a small nerve that runs under the foot where the main fascia attaches to the heel bone.  Stress placed on the plantar fascia due to bone spurs. RISK INCREASES WITH:   Activities that place stress on the plantar fascia (running, jumping, pivoting, or cutting).  Poor strength and flexibility.  Improperly fitted shoes.  Tight calf muscles.  Flat feet.  Failure to warm-up properly before activity.  Obesity. PREVENTION  Warm up and stretch properly before activity.  Allow for adequate recovery between workouts.  Maintain physical fitness:  Strength, flexibility, and endurance.  Cardiovascular fitness.  Maintain a health body weight.  Avoid stress on the plantar fascia.  Wear properly fitted shoes, including arch supports for individuals who have flat feet.  PROGNOSIS  If treated properly, then the symptoms of plantar fasciitis usually resolve without surgery. However, occasionally surgery is necessary.  RELATED COMPLICATIONS   Recurrent symptoms that may result in a chronic condition.  Problems of the lower back that are caused by compensating for the injury, such as limping.  Pain or weakness of the foot during push-off following surgery.  Chronic inflammation, scarring, and partial or complete fascia tear, occurring more often from repeated injections.  TREATMENT  Treatment initially involves the  use of ice and medication to help reduce pain and inflammation. The use of strengthening and stretching exercises may help reduce pain with activity, especially stretches of the Achilles tendon. These exercises may be performed at home or with a therapist. Your caregiver may recommend that you use heel cups of arch supports to help reduce stress on the plantar fascia. Occasionally, corticosteroid injections are given to reduce inflammation. If symptoms persist for greater than 6 months despite non-surgical (conservative), then surgery may be recommended.   MEDICATION   If pain medication is necessary, then nonsteroidal anti-inflammatory medications, such as aspirin and ibuprofen, or other minor pain relievers, such as acetaminophen, are often recommended.  Do not take pain medication within 7 days before surgery.  Prescription pain relievers may be given if deemed necessary by your caregiver. Use only as directed and only as much as you need.  Corticosteroid injections may be given by your caregiver. These injections should be reserved for the most serious cases, because they may only be given a certain number of times.  HEAT AND COLD  Cold treatment (icing) relieves pain and reduces inflammation. Cold treatment should be applied for 10 to 15 minutes every 2 to 3 hours for inflammation and pain and immediately after any activity that aggravates your symptoms. Use ice packs or massage the area with a piece of ice (ice massage).  Heat treatment may be used prior to performing the stretching and strengthening activities prescribed by your caregiver, physical therapist, or athletic trainer. Use a heat pack or soak the injury in warm water.  SEEK IMMEDIATE MEDICAL CARE IF:  Treatment seems to offer no benefit, or the condition worsens.  Any medications   produce adverse side effects.  EXERCISES- RANGE OF MOTION (ROM) AND STRETCHING EXERCISES - Plantar Fasciitis (Heel Spur Syndrome) These exercises  may help you when beginning to rehabilitate your injury. Your symptoms may resolve with or without further involvement from your physician, physical therapist or athletic trainer. While completing these exercises, remember:   Restoring tissue flexibility helps normal motion to return to the joints. This allows healthier, less painful movement and activity.  An effective stretch should be held for at least 30 seconds.  A stretch should never be painful. You should only feel a gentle lengthening or release in the stretched tissue.  RANGE OF MOTION - Toe Extension, Flexion  Sit with your right / left leg crossed over your opposite knee.  Grasp your toes and gently pull them back toward the top of your foot. You should feel a stretch on the bottom of your toes and/or foot.  Hold this stretch for 10 seconds.  Now, gently pull your toes toward the bottom of your foot. You should feel a stretch on the top of your toes and or foot.  Hold this stretch for 10 seconds. Repeat  times. Complete this stretch 3 times per day.   RANGE OF MOTION - Ankle Dorsiflexion, Active Assisted  Remove shoes and sit on a chair that is preferably not on a carpeted surface.  Place right / left foot under knee. Extend your opposite leg for support.  Keeping your heel down, slide your right / left foot back toward the chair until you feel a stretch at your ankle or calf. If you do not feel a stretch, slide your bottom forward to the edge of the chair, while still keeping your heel down.  Hold this stretch for 10 seconds. Repeat 3 times. Complete this stretch 2 times per day.   STRETCH  Gastroc, Standing  Place hands on wall.  Extend right / left leg, keeping the front knee somewhat bent.  Slightly point your toes inward on your back foot.  Keeping your right / left heel on the floor and your knee straight, shift your weight toward the wall, not allowing your back to arch.  You should feel a gentle stretch  in the right / left calf. Hold this position for 10 seconds. Repeat 3 times. Complete this stretch 2 times per day.  STRETCH  Soleus, Standing  Place hands on wall.  Extend right / left leg, keeping the other knee somewhat bent.  Slightly point your toes inward on your back foot.  Keep your right / left heel on the floor, bend your back knee, and slightly shift your weight over the back leg so that you feel a gentle stretch deep in your back calf.  Hold this position for 10 seconds. Repeat 3 times. Complete this stretch 2 times per day.  STRETCH  Gastrocsoleus, Standing  Note: This exercise can place a lot of stress on your foot and ankle. Please complete this exercise only if specifically instructed by your caregiver.   Place the ball of your right / left foot on a step, keeping your other foot firmly on the same step.  Hold on to the wall or a rail for balance.  Slowly lift your other foot, allowing your body weight to press your heel down over the edge of the step.  You should feel a stretch in your right / left calf.  Hold this position for 10 seconds.  Repeat this exercise with a slight bend in your right /   left knee. Repeat 3 times. Complete this stretch 2 times per day.   STRENGTHENING EXERCISES - Plantar Fasciitis (Heel Spur Syndrome)  These exercises may help you when beginning to rehabilitate your injury. They may resolve your symptoms with or without further involvement from your physician, physical therapist or athletic trainer. While completing these exercises, remember:   Muscles can gain both the endurance and the strength needed for everyday activities through controlled exercises.  Complete these exercises as instructed by your physician, physical therapist or athletic trainer. Progress the resistance and repetitions only as guided.  STRENGTH - Towel Curls  Sit in a chair positioned on a non-carpeted surface.  Place your foot on a towel, keeping your heel  on the floor.  Pull the towel toward your heel by only curling your toes. Keep your heel on the floor. Repeat 3 times. Complete this exercise 2 times per day.  STRENGTH - Ankle Inversion  Secure one end of a rubber exercise band/tubing to a fixed object (table, pole). Loop the other end around your foot just before your toes.  Place your fists between your knees. This will focus your strengthening at your ankle.  Slowly, pull your big toe up and in, making sure the band/tubing is positioned to resist the entire motion.  Hold this position for 10 seconds.  Have your muscles resist the band/tubing as it slowly pulls your foot back to the starting position. Repeat 3 times. Complete this exercises 2 times per day.  Document Released: 10/02/2005 Document Revised: 12/25/2011 Document Reviewed: 01/14/2009 [facility] Patient Information 2014 [place], [place].    Hammer Toe Hammer toe is a change in the shape (a deformity) of your second, third, or fourth toe. The deformity causes the middle joint of your toe to stay bent. This causes pain, especially when you are wearing shoes. Hammer toe starts gradually. At first, the toe can be straightened. Gradually over time, the deformity becomes stiff and permanent. Early treatments to keep the toe straight may relieve pain. As the deformity becomes stiff and permanent, surgery may be needed to straighten the toe. What are the causes? Hammer toe is caused by abnormal bending of the toe joint that is closest to your foot. It happens gradually over time. This pulls on the muscles and connections (tendons) of the toe joint, making them weak and stiff. It is often related to wearing shoes that are too short or narrow and do not let your toes straighten. What increases the risk? You may be at greater risk for hammer toe if you:  Are male.  Are older.  Wear shoes that are too small.  Wear high-heeled shoes that pinch your toes.  Are a Government social research officerballet  dancer.  Have a second toe that is longer than your big toe (first toe).  Injure your foot or toe.  Have arthritis.  Have a family history of hammer toe.  Have a nerve or muscle disorder.  What are the signs or symptoms? The main symptoms of this condition are pain and deformity of the toe. The pain is worse when wearing shoes, walking, or running. Other symptoms may include:  Corns or calluses over the bent part of the toe or between the toes.  Redness and a burning feeling on the toe.  An open sore that forms on the top of the toe.  Not being able to straighten the toe.  How is this diagnosed? This condition is diagnosed based on your symptoms and a physical exam. During the exam,  your health care provider will try to straighten your toe to see how stiff the deformity is. You may also have tests, such as:  A blood test to check for rheumatoid arthritis.  An X-ray to show how severe the deformity is.  How is this treated? Treatment for this condition will depend on how stiff the deformity is. Surgery is often needed. However, sometimes a hammer toe can be straightened without surgery. Treatments that do not involve surgery include:  Taping the toe into a straightened position.  Using pads and cushions to protect the toe (orthotics).  Wearing shoes that provide enough room for the toes.  Doing toe-stretching exercises at home.  Taking an NSAID to reduce pain and swelling.  If these treatments do not help or the toe cannot be straightened, surgery is the next option. The most common surgeries used to straighten a hammer toe include:  Arthroplasty. In this procedure, part of the joint is removed, and that allows the toe to straighten.  Fusion. In this procedure, cartilage between the two bones of the joint is taken out and the bones are fused together into one longer bone.  Implantation. In this procedure, part of the bone is removed and replaced with an implant to let  the toe move again.  Flexor tendon transfer. In this procedure, the tendons that curl the toes down (flexor tendons) are repositioned.  Follow these instructions at home:  Take over-the-counter and prescription medicines only as told by your health care provider.  Do toe straightening and stretching exercises as told by your health care provider.  Keep all follow-up visits as told by your health care provider. This is important. How is this prevented?  Wear shoes that give your toes enough room and do not cause pain.  Do not wear high-heeled shoes. Contact a health care provider if:  Your pain gets worse.  Your toe becomes red or swollen.  You develop an open sore on your toe. This information is not intended to replace advice given to you by your health care provider. Make sure you discuss any questions you have with your health care provider. Document Released: 09/29/2000 Document Revised: 04/21/2016 Document Reviewed: 01/26/2016 Elsevier Interactive Patient Education  [employer].

## 2017-11-25 DIAGNOSIS — G4733 Obstructive sleep apnea (adult) (pediatric): Secondary | ICD-10-CM | POA: Diagnosis not present

## 2017-11-29 ENCOUNTER — Ambulatory Visit: Payer: 59 | Admitting: Podiatry

## 2017-11-29 ENCOUNTER — Encounter: Payer: Self-pay | Admitting: Podiatry

## 2017-11-29 ENCOUNTER — Ambulatory Visit: Payer: 59 | Admitting: Orthotics

## 2017-11-29 DIAGNOSIS — M722 Plantar fascial fibromatosis: Secondary | ICD-10-CM

## 2017-11-29 DIAGNOSIS — M2041 Other hammer toe(s) (acquired), right foot: Secondary | ICD-10-CM

## 2017-11-29 DIAGNOSIS — M2042 Other hammer toe(s) (acquired), left foot: Principal | ICD-10-CM

## 2017-11-29 NOTE — Progress Notes (Signed)
Subjective:   Patient ID: Dalton BockStan R Saran, male   DOB: 57 y.o.   MRN: [id number]   HPI Patient states his heel is feeling much better with significant diminishment of discomfort and he is here also to get scanned for his orthotics today due to his significant cavus foot type and digital deformities neurovascular status intact with patient's feet   ROS      Objective:  Physical Exam  Doing much better with significant diminishment of plantar heel pain noted     Assessment:  Much improvement in the plantar fascial bilateral with diminished discomfort with severe cavus foot structure     Plan:  H&P condition reviewed and discussed continued plantar fascial care including stretching exercises and supportive shoes and patient is seen by ped orthotist for orthotic casting today

## 2017-11-29 NOTE — Progress Notes (Signed)
Patient came in today to pick up custom made foot orthotics.  The goals were accomplished and the patient reported no dissatisfaction with said orthotics.  Patient was advised of breakin period and how to report any issues. 

## 2017-12-23 DIAGNOSIS — G4733 Obstructive sleep apnea (adult) (pediatric): Secondary | ICD-10-CM | POA: Diagnosis not present

## 2018-01-23 DIAGNOSIS — G4733 Obstructive sleep apnea (adult) (pediatric): Secondary | ICD-10-CM | POA: Diagnosis not present

## 2018-02-18 DIAGNOSIS — G4733 Obstructive sleep apnea (adult) (pediatric): Secondary | ICD-10-CM | POA: Diagnosis not present

## 2018-02-22 DIAGNOSIS — G4733 Obstructive sleep apnea (adult) (pediatric): Secondary | ICD-10-CM | POA: Diagnosis not present

## 2018-02-26 ENCOUNTER — Encounter: Payer: Self-pay | Admitting: Neurology

## 2018-02-26 ENCOUNTER — Ambulatory Visit: Payer: 59 | Admitting: Neurology

## 2018-02-26 VITALS — BP 152/88 | HR 77 | Ht 75.0 in | Wt 256.0 lb

## 2018-02-26 DIAGNOSIS — G2581 Restless legs syndrome: Secondary | ICD-10-CM

## 2018-02-26 DIAGNOSIS — Z9989 Dependence on other enabling machines and devices: Secondary | ICD-10-CM | POA: Diagnosis not present

## 2018-02-26 DIAGNOSIS — G4733 Obstructive sleep apnea (adult) (pediatric): Secondary | ICD-10-CM

## 2018-02-26 NOTE — Patient Instructions (Signed)
Please consider trying the gabapentin 100 mg before sleep.   Please get back on track with using the CPAP more regularly.   Please be sure to change your filter every 1-2 months, your mask about every 3 months or every 2 months for your nasal insert, hose about 6 months, humidifier chamber about yearly. Some restrictions are imposed by her insurance carrier with regard to how frequently you can get certain supplies.   The address for [facility] is: [address] [phone].   Please continue using your CPAP regularly. While your insurance requires that you use CPAP at least 4 hours each night on 70% of the nights, I recommend, that you not skip any nights and use it throughout the night if you can. Getting used to CPAP and staying with the treatment long term does take time and patience and discipline. Untreated obstructive sleep apnea when it is moderate to severe can have an adverse impact on cardiovascular health and raise her risk for heart disease, arrhythmias, hypertension, congestive heart failure, stroke and diabetes. Untreated obstructive sleep apnea causes sleep disruption, nonrestorative sleep, and sleep deprivation. This can have an impact on your day to day functioning and cause daytime sleepiness and impairment of cognitive function, memory loss, mood disturbance, and problems focussing. Using CPAP regularly can improve these symptoms.
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
## 2018-02-27 DIAGNOSIS — G4733 Obstructive sleep apnea (adult) (pediatric): Secondary | ICD-10-CM | POA: Diagnosis not present

## 2018-03-25 DIAGNOSIS — G4733 Obstructive sleep apnea (adult) (pediatric): Secondary | ICD-10-CM | POA: Diagnosis not present

## 2018-04-24 DIAGNOSIS — G4733 Obstructive sleep apnea (adult) (pediatric): Secondary | ICD-10-CM | POA: Diagnosis not present

## 2019-01-07 DIAGNOSIS — G4733 Obstructive sleep apnea (adult) (pediatric): Secondary | ICD-10-CM | POA: Diagnosis not present

## 2019-02-26 ENCOUNTER — Telehealth: Payer: Self-pay | Admitting: Neurology

## 2019-02-26 NOTE — Telephone Encounter (Signed)
Due to current COVID 19 pandemic, our office is severely reducing in office visits until further notice, in order to minimize the risk to our patients and healthcare providers.   Called patient to offer virtual visit for 5/19 appt. Patient accepted and  verbalized understanding of the doxy process. I have sent patient an e-mail with link and instructions to his wife's e-mail, [email or web address], as well as my name and office number/hours. Patient understands that she will receive a call from RN prior to appt.  Pt understands that although there may be some limitations with this type of visit, we will take all precautions to reduce any security or privacy concerns.  Pt understands that this will be treated like an in office visit and we will file with pt's insurance, and there may be a patient responsible charge related to this service.

## 2019-03-03 NOTE — Telephone Encounter (Signed)
I called pt. Pt's meds, allergies, and PMH were updated.  Pt understands the doxy.me process.  Pt reports that his cpap is going well.

## 2019-03-03 NOTE — Addendum Note (Signed)
Addended by: Geronimo Running A on: 03/03/2019 04:45 PM   Modules accepted: Orders

## 2019-03-03 NOTE — Telephone Encounter (Signed)
I called pt to update his chart prior to his virtual visit tomorrow with Dr. Frances Furbish. No answer, VM is full, will try again later.

## 2019-03-04 ENCOUNTER — Ambulatory Visit (INDEPENDENT_AMBULATORY_CARE_PROVIDER_SITE_OTHER): Payer: 59 | Admitting: Neurology

## 2019-03-04 ENCOUNTER — Other Ambulatory Visit: Payer: Self-pay

## 2019-03-04 ENCOUNTER — Encounter: Payer: Self-pay | Admitting: Neurology

## 2019-03-04 DIAGNOSIS — Z9989 Dependence on other enabling machines and devices: Secondary | ICD-10-CM | POA: Diagnosis not present

## 2019-03-04 DIAGNOSIS — G4733 Obstructive sleep apnea (adult) (pediatric): Secondary | ICD-10-CM

## 2019-03-04 NOTE — Patient Instructions (Signed)
Given verbally, during today's virtual video-based encounter, with verbal feedback received.   
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
## 2019-03-04 NOTE — Progress Notes (Signed)
Order for cpap supplies sent to [facility] via community message. Confirmation received that the order transmitted was successful.  

## 2019-03-06 ENCOUNTER — Telehealth: Payer: Self-pay

## 2019-03-06 NOTE — Telephone Encounter (Signed)
I called pt, scheduled him for his yearly f/u with MM. Pt verbalized understanding of new appt date and time. 

## 2019-03-31 ENCOUNTER — Telehealth: Payer: Self-pay | Admitting: Family Medicine

## 2019-03-31 ENCOUNTER — Other Ambulatory Visit: Payer: Self-pay | Admitting: Family Medicine

## 2019-03-31 MED ORDER — NAPROXEN 500 MG PO TABS: 500.0000 mg | ORAL_TABLET | Freq: Two times a day (BID) | ORAL | 0 refills | Status: DC

## 2019-03-31 NOTE — Telephone Encounter (Signed)
Pt hurt his back this weekend and would like to know if we could call him in another rx for naprosyn. Pt has not been seen here since 04/24/2017. Pt is willing to do phone visit if needed.

## 2019-03-31 NOTE — Telephone Encounter (Signed)
Pt aware via vm 

## 2019-03-31 NOTE — Telephone Encounter (Signed)
I sent naprosyn to [facility]

## 2019-04-21 ENCOUNTER — Ambulatory Visit: Payer: 59 | Admitting: Podiatry

## 2019-04-21 ENCOUNTER — Other Ambulatory Visit: Payer: Self-pay | Admitting: Podiatry

## 2019-04-21 ENCOUNTER — Ambulatory Visit (INDEPENDENT_AMBULATORY_CARE_PROVIDER_SITE_OTHER): Payer: 59

## 2019-04-21 ENCOUNTER — Encounter: Payer: Self-pay | Admitting: Podiatry

## 2019-04-21 ENCOUNTER — Other Ambulatory Visit: Payer: Self-pay

## 2019-04-21 VITALS — Temp 98.7°F

## 2019-04-21 DIAGNOSIS — L600 Ingrowing nail: Secondary | ICD-10-CM

## 2019-04-21 DIAGNOSIS — M722 Plantar fascial fibromatosis: Secondary | ICD-10-CM

## 2019-04-21 DIAGNOSIS — M79671 Pain in right foot: Secondary | ICD-10-CM

## 2019-04-21 MED ORDER — DICLOFENAC SODIUM 75 MG PO TBEC: 75.0000 mg | DELAYED_RELEASE_TABLET | Freq: Two times a day (BID) | ORAL | 2 refills | Status: DC

## 2019-04-21 NOTE — Progress Notes (Signed)
Subjective:   Patient ID: [employer], male   DOB: 58 y.o.   MRN: [id number]   HPI Patient states that he is having a lot of pain in his right plantar foot and the heel seems pretty decent but the mid arch is awful with mild discomfort also on the left.  States is having trouble bearing any form of weight on his foot due to the pain he is experiencing   ROS      Objective:  Physical Exam  Acute mid arch fasciitis right with mild discomfort left and severe on the right mid arch area     Assessment:  H&P conditions reviewed and today I went ahead and I did do a mid arch injection right 3 mg Kenalog 5 mg Xylocaine and I applied air fracture walker to completely immobilize and allow it to rest.  He will take several days off work this week and will be seen back for [place] to recheck     Plan:  Reviewed severe fasciitis symptoms and the above treatment was done for him  X-rays were negative for signs of flatfoot deformity spur formation or other pathology which could help explain the pain he is experiencing

## 2019-04-24 ENCOUNTER — Ambulatory Visit: Payer: 59 | Admitting: Podiatry

## 2019-04-25 DIAGNOSIS — M79676 Pain in unspecified toe(s): Secondary | ICD-10-CM

## 2019-04-29 ENCOUNTER — Other Ambulatory Visit: Payer: 59

## 2019-04-29 ENCOUNTER — Other Ambulatory Visit: Payer: Self-pay

## 2019-04-29 DIAGNOSIS — Z20822 Contact with and (suspected) exposure to covid-19: Secondary | ICD-10-CM

## 2019-05-03 LAB — NOVEL CORONAVIRUS, NAA: SARS-CoV-2, NAA: NOT DETECTED

## 2019-05-05 ENCOUNTER — Telehealth: Payer: Self-pay | Admitting: Family Medicine

## 2019-05-05 NOTE — Telephone Encounter (Signed)
Patient called in and received his results °

## 2019-05-06 ENCOUNTER — Encounter: Payer: Self-pay | Admitting: Family Medicine

## 2019-05-07 ENCOUNTER — Encounter: Payer: Self-pay | Admitting: Podiatry

## 2019-05-07 ENCOUNTER — Other Ambulatory Visit: Payer: Self-pay

## 2019-05-07 ENCOUNTER — Ambulatory Visit: Payer: 59 | Admitting: Podiatry

## 2019-05-07 VITALS — Temp 97.3°F

## 2019-05-07 DIAGNOSIS — M722 Plantar fascial fibromatosis: Secondary | ICD-10-CM | POA: Diagnosis not present

## 2019-05-07 NOTE — Progress Notes (Signed)
Subjective:   Patient ID: [employer], male   DOB: 58 y.o.   MRN: [id number]   HPI Patient states he is doing real well with diminished discomfort and so far is very satisfied   ROS      Objective:  Physical Exam  Neurovascular status intact with patient's right mid arch area significant reduction of discomfort with mild pain upon deep palpation but much better with moderate edema noted around the ankle and negative Homans sign     Assessment:  Doing well post fascial work right with minimal discomfort noted with mild swelling of the right lateral ankle that is probably related to compensation     Plan:  H&P condition reviewed and today I went ahead and I advised this patient on anti-inflammatories physical therapy and I did apply ankle compression stocking to reduce the swelling.  Reappoint if symptoms are indicated

## 2020-02-06 ENCOUNTER — Ambulatory Visit: Payer: 59

## 2020-02-06 ENCOUNTER — Encounter: Payer: Self-pay | Admitting: Orthopedic Surgery

## 2020-02-06 ENCOUNTER — Ambulatory Visit: Payer: 59 | Admitting: Orthopedic Surgery

## 2020-02-06 ENCOUNTER — Other Ambulatory Visit: Payer: Self-pay

## 2020-02-06 VITALS — BP 154/90 | HR 76 | Ht 75.0 in | Wt 254.0 lb

## 2020-02-06 DIAGNOSIS — M25462 Effusion, left knee: Secondary | ICD-10-CM

## 2020-02-06 DIAGNOSIS — M25562 Pain in left knee: Secondary | ICD-10-CM | POA: Diagnosis not present

## 2020-02-06 DIAGNOSIS — G8929 Other chronic pain: Secondary | ICD-10-CM

## 2020-02-06 NOTE — Patient Instructions (Addendum)
Knee Injection A knee injection is a procedure to get medicine into your knee joint to relieve the pain, swelling, and stiffness of arthritis. Your health care provider uses a needle to inject medicine, which may also help to lubricate and cushion your knee joint. You may need more than one injection. Tell a health care provider about:  Any allergies you have.  All medicines you are taking, including vitamins, herbs, eye drops, creams, and over-the-counter medicines.  Any problems you or family members have had with anesthetic medicines.  Any blood disorders you have.  Any surgeries you have had.  Any medical conditions you have.  Whether you are pregnant or may be pregnant. What are the risks? Generally, this is a safe procedure. However, problems may occur, including:  Infection.  Bleeding.  Symptoms that get worse.  Damage to the area around your knee.  Allergic reaction to any of the medicines.  Skin reactions from repeated injections. What happens before the procedure?  Ask your health care provider about changing or stopping your regular medicines. This is especially important if you are taking diabetes medicines or blood thinners.  Plan to have someone take you home from the hospital or clinic. What happens during the procedure?   You will sit or lie down in a position for your knee to be treated.  The skin over your kneecap will be cleaned with a germ-killing soap.  You will be given a medicine that numbs the area (local anesthetic). You may feel some stinging.  The medicine will be injected into your knee. The needle is carefully placed between your kneecap and your knee. The medicine is injected into the joint space.  The needle will be removed at the end of the procedure.  A bandage (dressing) may be placed over the injection site. The procedure may vary among health care providers and hospitals. What can I expect after the procedure?  Your blood  pressure, heart rate, breathing rate, and blood oxygen level will be monitored until you leave the hospital or clinic.  You may have to move your knee through its full range of motion. This helps to get all the medicine into your joint space.  You will be watched to make sure that you do not have a reaction to the injected medicine.  You may feel more pain, swelling, and warmth than you did before the injection. This reaction may last about 1-2 days. Follow these instructions at home: Medicines  Take over-the-counter and prescription medicines only as told by your doctor.  Do not drive or use heavy machinery while taking prescription pain medicine.  Do not take medicines such as aspirin and ibuprofen unless your health care provider tells you to take them. Injection site care  Follow instructions from your health care provider about: ? How to take care of your puncture site. ? When and how you should change your dressing. ? When you should remove your dressing.  Check your injection area every day for signs of infection. Check for: ? More redness, swelling, or pain after 2 days. ? Fluid or blood. ? Pus or a bad smell. ? Warmth. Managing pain, stiffness, and swelling   If directed, put ice on the injection area: ? Put ice in a plastic bag. ? Place a towel between your skin and the bag. ? Leave the ice on for 20 minutes, 2-3 times per day.  Do not apply heat to your knee.  Raise (elevate) the injection area above the level   of your heart while you are sitting or lying down. General instructions  If you were given a dressing, keep it dry until your health care provider says it can be removed. Ask your health care provider when you can start showering or taking a bath.  Avoid strenuous activities for as long as directed by your health care provider. Ask your health care provider when you can return to your normal activities.  Keep all follow-up visits as told by your health  care provider. This is important. You may need more injections. Contact a health care provider if you have:  A fever.  Warmth in your injection area.  Fluid, blood, or pus coming from your injection site.  Symptoms at your injection site that last longer than 2 days after your procedure. Get help right away if:  Your knee: ? Turns very red. ? Becomes very swollen. ? Is in severe pain. Summary  A knee injection is a procedure to get medicine into your knee joint to relieve the pain, swelling, and stiffness of arthritis.  A needle is carefully placed between your kneecap and your knee to inject medicine into the joint space.  Before the procedure, ask your health care provider about changing or stopping your regular medicines, especially if you are taking diabetes medicines or blood thinners.  Contact your health care provider if you have any problems or questions after your procedure. This information is not intended to replace advice given to you by your health care provider. Make sure you discuss any questions you have with your health care provider. Document Revised: 10/22/2017 Document Reviewed: 10/22/2017 Elsevier Patient Education  2020 [employer].   Take your voltaren  Ice your knee 3 x a day for 30 min next 7 days   Modify your activity for next 2 weeks
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
## 2020-03-03 ENCOUNTER — Encounter: Payer: Self-pay | Admitting: Adult Health

## 2020-03-08 ENCOUNTER — Ambulatory Visit: Payer: 59 | Admitting: Adult Health

## 2020-03-08 ENCOUNTER — Other Ambulatory Visit: Payer: Self-pay

## 2020-03-08 ENCOUNTER — Encounter: Payer: Self-pay | Admitting: Adult Health

## 2020-03-08 VITALS — BP 125/75 | HR 62 | Ht 75.0 in | Wt 256.0 lb

## 2020-03-08 DIAGNOSIS — G4733 Obstructive sleep apnea (adult) (pediatric): Secondary | ICD-10-CM

## 2020-03-08 DIAGNOSIS — Z9989 Dependence on other enabling machines and devices: Secondary | ICD-10-CM | POA: Diagnosis not present
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
## 2020-03-08 NOTE — Patient Instructions (Signed)
Continue using CPAP nightly and greater than 4 hours each night °If your symptoms worsen or you develop new symptoms please let us know.  ° °

## 2020-03-10 ENCOUNTER — Encounter: Payer: Self-pay | Admitting: Orthopedic Surgery

## 2020-03-10 ENCOUNTER — Other Ambulatory Visit: Payer: Self-pay

## 2020-03-10 ENCOUNTER — Ambulatory Visit: Payer: 59 | Admitting: Orthopedic Surgery

## 2020-03-10 VITALS — BP 147/101 | HR 77 | Ht 75.0 in | Wt 253.0 lb

## 2020-03-10 DIAGNOSIS — M25562 Pain in left knee: Secondary | ICD-10-CM | POA: Diagnosis not present

## 2020-03-10 DIAGNOSIS — G8929 Other chronic pain: Secondary | ICD-10-CM

## 2020-03-10 DIAGNOSIS — M25462 Effusion, left knee: Secondary | ICD-10-CM | POA: Diagnosis not present

## 2020-03-10 MED ORDER — DICLOFENAC SODIUM 75 MG PO TBEC: 75.0000 mg | DELAYED_RELEASE_TABLET | Freq: Two times a day (BID) | ORAL | 2 refills | Status: DC

## 2020-03-10 NOTE — Progress Notes (Signed)
Chief Complaint  Patient presents with  . Knee Pain    left knee feels better    Dalton Bradley has osteoarthritis of his left knee status post aspiration injection has been on diclofenac he comes in for recheck  He says his knee feels pretty good he does get some tightness when he bends it he may have some swelling but overall is been able to return to his normal activities which include projects around the house and painting  Examination shows an effusion of the left knee but he can bend it well past 90 degrees he has full extension with may be some varus alignment  The medication appears to be working we will continue it  X-rays in a year unless he has a acute problem  Chronic problem stable, medication refill Meds ordered this encounter  Medications  . diclofenac (VOLTAREN) 75 MG EC tablet    Sig: Take 1 tablet (75 mg total) by mouth 2 (two) times daily.    Dispense:  50 tablet    Refill:  2

## 2020-07-22 ENCOUNTER — Other Ambulatory Visit: Payer: 59

## 2020-07-22 DIAGNOSIS — Z20822 Contact with and (suspected) exposure to covid-19: Secondary | ICD-10-CM

## 2020-07-23 LAB — NOVEL CORONAVIRUS, NAA: SARS-CoV-2, NAA: NOT DETECTED

## 2020-07-23 LAB — SARS-COV-2, NAA 2 DAY TAT

## 2021-03-03 ENCOUNTER — Ambulatory Visit: Payer: 59

## 2021-03-03 ENCOUNTER — Ambulatory Visit: Payer: 59 | Admitting: Orthopedic Surgery

## 2021-03-03 ENCOUNTER — Other Ambulatory Visit: Payer: Self-pay

## 2021-03-03 ENCOUNTER — Encounter: Payer: Self-pay | Admitting: Orthopedic Surgery

## 2021-03-03 VITALS — BP 132/97 | HR 69 | Ht 75.0 in | Wt 261.0 lb

## 2021-03-03 DIAGNOSIS — G8929 Other chronic pain: Secondary | ICD-10-CM

## 2021-03-03 DIAGNOSIS — M25562 Pain in left knee: Secondary | ICD-10-CM

## 2021-03-03 DIAGNOSIS — M1712 Unilateral primary osteoarthritis, left knee: Secondary | ICD-10-CM

## 2021-03-03 NOTE — Progress Notes (Signed)
Follow-up visit  Encounter Diagnosis  Name Primary?  . Chronic pain of left knee Yes    60 year old male with arthritis medial compartment left knee comes in today for 1 year follow-up.  We did put him on a anti-inflammatory diclofenac but he is not taking it because is not having any pain  He is completely asymptomatic  Exam shows a varus knee with full range of motion no swelling is nontender his knee is stable muscle strength and tone are normal neurovascular exam is intact skin over the left knee is normal  His gait is normal  X-ray compared to last year shows no progression of his narrowing of the medial side the osteophytes may be a little bit bigger  He has decided to call us on a symptomatic basis when his knee is bothering him.  No need to take anti-inflammatories at this time

## 2021-03-09 ENCOUNTER — Ambulatory Visit (INDEPENDENT_AMBULATORY_CARE_PROVIDER_SITE_OTHER): Payer: 59 | Admitting: Adult Health

## 2021-03-09 VITALS — BP 153/76 | HR 79 | Ht 75.0 in | Wt 260.0 lb

## 2021-03-09 DIAGNOSIS — G4733 Obstructive sleep apnea (adult) (pediatric): Secondary | ICD-10-CM

## 2021-03-09 DIAGNOSIS — Z9989 Dependence on other enabling machines and devices: Secondary | ICD-10-CM

## 2021-03-09 NOTE — Patient Instructions (Signed)
Continue using CPAP nightly and greater than 4 hours each night °If your symptoms worsen or you develop new symptoms please let us know.  ° °
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
## 2021-03-11 ENCOUNTER — Ambulatory Visit: Payer: 59 | Admitting: Orthopedic Surgery

## 2021-03-17 ENCOUNTER — Ambulatory Visit: Payer: 59 | Admitting: Family Medicine

## 2021-03-17 ENCOUNTER — Encounter: Payer: Self-pay | Admitting: Family Medicine

## 2021-03-17 ENCOUNTER — Other Ambulatory Visit: Payer: Self-pay

## 2021-03-17 VITALS — BP 138/84 | HR 66 | Temp 98.9°F | Resp 16 | Ht 74.5 in | Wt 262.0 lb

## 2021-03-17 DIAGNOSIS — F419 Anxiety disorder, unspecified: Secondary | ICD-10-CM

## 2021-03-17 MED ORDER — ALPRAZOLAM 0.5 MG PO TABS: 0.5000 mg | ORAL_TABLET | Freq: Two times a day (BID) | ORAL | 0 refills | Status: DC | PRN

## 2021-03-17 MED ORDER — MOMETASONE FUROATE 0.1 % EX CREA: TOPICAL_CREAM | Freq: Every day | CUTANEOUS | 1 refills | Status: DC
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
## 2021-04-19 ENCOUNTER — Other Ambulatory Visit: Payer: Self-pay | Admitting: *Deleted

## 2021-04-19 MED ORDER — ALPRAZOLAM 0.5 MG PO TABS: 0.5000 mg | ORAL_TABLET | Freq: Two times a day (BID) | ORAL | 3 refills | Status: DC | PRN

## 2021-04-19 NOTE — Telephone Encounter (Signed)
Received call from patient.   Requested refill on Xanax.   Ok to refill??  Last office visit/ refill 03/17/2021.

## 2022-01-31 ENCOUNTER — Telehealth: Payer: Self-pay | Admitting: Adult Health

## 2022-01-31 NOTE — Telephone Encounter (Signed)
Rescheduled 03/09/22 appointment (Megan out) with pt over the phone. ?
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
## 2022-03-07 ENCOUNTER — Encounter: Payer: Self-pay | Admitting: Adult Health

## 2022-03-07 ENCOUNTER — Ambulatory Visit: Payer: 59 | Admitting: Adult Health

## 2022-03-07 VITALS — BP 125/76 | HR 61 | Ht 75.0 in | Wt 258.0 lb

## 2022-03-07 DIAGNOSIS — G4733 Obstructive sleep apnea (adult) (pediatric): Secondary | ICD-10-CM

## 2022-03-07 DIAGNOSIS — Z9989 Dependence on other enabling machines and devices: Secondary | ICD-10-CM

## 2022-03-07 NOTE — Patient Instructions (Signed)
Continue using CPAP nightly and greater than 4 hours each night °If your symptoms worsen or you develop new symptoms please let us know.  ° °

## 2022-03-09 ENCOUNTER — Ambulatory Visit: Payer: 59 | Admitting: Adult Health

## 2022-08-06 ENCOUNTER — Encounter (HOSPITAL_COMMUNITY): Payer: Self-pay

## 2022-08-06 ENCOUNTER — Other Ambulatory Visit: Payer: Self-pay

## 2022-08-06 ENCOUNTER — Emergency Department (HOSPITAL_COMMUNITY): Payer: 59

## 2022-08-06 ENCOUNTER — Emergency Department (HOSPITAL_COMMUNITY)
Admission: EM | Admit: 2022-08-06 | Discharge: 2022-08-06 | Disposition: A | Discharge status: Home / Self Care | Payer: 59 | Attending: Emergency Medicine | Admitting: Emergency Medicine

## 2022-08-06 DIAGNOSIS — M546 Pain in thoracic spine: Secondary | ICD-10-CM | POA: Insufficient documentation

## 2022-08-06 DIAGNOSIS — M549 Dorsalgia, unspecified: Secondary | ICD-10-CM

## 2022-08-06 LAB — BASIC METABOLIC PANEL
Anion gap: 8 (ref 5–15)
BUN: 11 mg/dL (ref 8–23)
CO2: 25 mmol/L (ref 22–32)
Calcium: 8.8 mg/dL — ABNORMAL LOW (ref 8.9–10.3)
Chloride: 101 mmol/L (ref 98–111)
Creatinine, Ser: 1.11 mg/dL (ref 0.61–1.24)
GFR, Estimated: >60 mL/min (ref >60)
Glucose, Bld: 105 mg/dL — ABNORMAL HIGH (ref 70–99)
Potassium: 4.2 mmol/L (ref 3.5–5.1)
Sodium: 134 mmol/L — ABNORMAL LOW (ref 135–145)

## 2022-08-06 LAB — CBC
HCT: 48 % (ref 39.0–52.0)
Hemoglobin: 16.7 g/dL (ref 13.0–17.0)
MCH: 32.7 pg (ref 26.0–34.0)
MCHC: 34.8 g/dL (ref 30.0–36.0)
MCV: 93.9 fL (ref 80.0–100.0)
Platelets: 198 10*3/uL (ref 150–400)
RBC: 5.11 MIL/uL (ref 4.22–5.81)
RDW: 12.4 % (ref 11.5–15.5)
WBC: 7.6 10*3/uL (ref 4.0–10.5)
nRBC: 0 % (ref 0.0–0.2)

## 2022-08-06 LAB — TROPONIN I (HIGH SENSITIVITY): Troponin I (High Sensitivity): 3 ng/L (ref <18)

## 2022-08-06 MED ORDER — IBUPROFEN 600 MG PO TABS: 600.0000 mg | ORAL_TABLET | Freq: Four times a day (QID) | ORAL | 0 refills | Status: DC | PRN

## 2022-08-06 MED ORDER — IBUPROFEN 800 MG PO TABS
800.0000 mg | ORAL_TABLET | Freq: Once | ORAL | Status: AC
  Administered 2022-08-06: 800 mg via ORAL

## 2022-08-06 MED ORDER — METHOCARBAMOL 500 MG PO TABS
500.0000 mg | ORAL_TABLET | Freq: Once | ORAL | Status: AC
  Administered 2022-08-06: 500 mg via ORAL
  Filled 2022-08-06: qty 1

## 2022-08-06 MED ORDER — MORPHINE SULFATE (PF) 4 MG/ML IV SOLN
4.0000 mg | Freq: Once | INTRAVENOUS | Status: AC
  Administered 2022-08-06: 4 mg via INTRAVENOUS
  Filled 2022-08-06: qty 1

## 2022-08-06 MED ORDER — CYCLOBENZAPRINE HCL 10 MG PO TABS: 10.0000 mg | ORAL_TABLET | Freq: Two times a day (BID) | ORAL | 0 refills | Status: DC | PRN

## 2022-08-06 NOTE — ED Triage Notes (Signed)
Pt reports back pain that has radiated up to his left arm.

## 2022-08-06 NOTE — ED Provider Triage Note (Signed)
Emergency Medicine Provider Triage Evaluation Note  Dalton Bradley , a 61 y.o. male with past medical history of back pain obstructive sleep apnea and GERD was evaluated in triage.  Pt complains of left arm and left upper back pain.  Symptoms began this morning upon waking.  He describes pain as an aching sensation along his shoulder blade area that has radiated to his left arm.  Pain does not travel below the elbow.  No aggravating or alleviating factors.  He denies any shortness of breath or known injury.  No cough, fever or chills.  Review of Systems  Positive: Upper back pain left arm pain Negative: Cough, fever, chills,  Physical Exam  BP 123/73 (BP Location: Right Arm)   Pulse 76   Temp 98.3 F (36.8 C) (Oral)   Resp 18   Ht 6\' 3"  (1.905 m)   Wt 113.4 kg   SpO2 100%   BMI 31.25 kg/m  Gen:   Awake, no distress   Resp:  Normal effort  MSK:   Moves extremities without difficulty  Other:  No upper extremity weakness or tenderness to palpation of the upper left back  Medical Decision Making  Medically screening exam initiated at 1:26 PM.  Appropriate orders placed.  Dalton Bradley was informed that the remainder of the evaluation will be completed by another provider, this initial triage assessment does not replace that evaluation, and the importance of remaining in the ED until their evaluation is complete.     Kem Parkinson, PA-C 08/06/22 1342

## 2022-08-06 NOTE — ED Provider Notes (Signed)
[facility] EMERGENCY DEPARTMENT Provider Note   CSN: [id number] Arrival date & time: 08/06/22  1152     History  No chief complaint on file.   Dalton Bradley is a 61 y.o. male.  The history is provided by the patient and medical records. No language interpreter was used.     61 year old male significant history of esophageal dysphagia, GERD, OSA presenting with complaint of back pain.  Patient developed pain to his left side upper back radiates towards his left shoulder and arm that started earlier today.  Described pain more as a cramping sharp sensation that has been waxing waning but not fully resolved.  Pain felt different from usual back pain that he has had in the past.  No associated lightheadedness or dizziness no nausea vomiting diaphoresis or shortness of breath.  He denies any history of cardiac disease.  He denies tobacco or alcohol use.  He denies any recent strenuous activities or heavy lifting.  He did try taking some Tylenol earlier in the day without relief.  Home Medications Prior to Admission medications   Medication Sig Start Date End Date Taking? Authorizing Provider  ALPRAZolam Prudy Feeler) 0.5 MG tablet Take 1 tablet (0.5 mg total) by mouth 2 (two) times daily as needed for anxiety or sleep. 04/19/21   Donita Brooks, MD  mometasone (ELOCON) 0.1 % cream Apply topically daily. 03/17/21   Donita Brooks, MD      Allergies    Other    Review of Systems   Review of Systems  All other systems reviewed and are negative.   Physical Exam Updated Vital Signs BP 123/73 (BP Location: Right Arm)   Pulse 76   Temp 98.3 F (36.8 C) (Oral)   Resp 18   Ht 6\' 3"  (1.905 m)   Wt 113.4 kg   SpO2 100%   BMI 31.25 kg/m  Physical Exam Vitals and nursing note reviewed.  Constitutional:      General: He is not in acute distress.    Appearance: He is well-developed.  HENT:     Head: Atraumatic.  Eyes:     Conjunctiva/sclera: Conjunctivae normal.  Cardiovascular:      Rate and Rhythm: Normal rate and regular rhythm.     Pulses: Normal pulses.     Heart sounds: Normal heart sounds.  Pulmonary:     Effort: Pulmonary effort is normal.     Breath sounds: Normal breath sounds.  Chest:     Chest wall: No tenderness.  Abdominal:     Palpations: Abdomen is soft.     Tenderness: There is no abdominal tenderness.  Musculoskeletal:     Cervical back: Neck supple.     Right lower leg: No edema.     Left lower leg: No edema.  Skin:    Findings: No rash.  Neurological:     Mental Status: He is alert.     ED Results / Procedures / Treatments   Labs (all labs ordered are listed, but only abnormal results are displayed) Labs Reviewed  BASIC METABOLIC PANEL - Abnormal; Notable for the following components:      Result Value   Sodium 134 (*)    Glucose, Bld 105 (*)    Calcium 8.8 (*)    All other components within normal limits  CBC  TROPONIN I (HIGH SENSITIVITY)  TROPONIN I (HIGH SENSITIVITY)    EKG EKG Interpretation  Date/Time:  Sunday August 06 2022 12:10:04 EDT Ventricular Rate:  70 PR  Interval:  158 QRS Duration: 98 QT Interval:  380 QTC Calculation: 410 R Axis:   -16 Text Interpretation: Sinus rhythm Premature ventricular complexes Otherwise normal ECG No previous ECGs available Confirmed by Alvino Blood ([id number]) on 08/06/2022 4:44:14 PM  Radiology DG Chest Port 1 View  Result Date: 08/06/2022 CLINICAL DATA:  Left posterior chest pain. EXAM: PORTABLE CHEST 1 VIEW COMPARISON:  One-view chest x-ray 10/23/2008 FINDINGS: Heart size is normal. Mild interstitial coarsening is chronic. No superimposed edema or effusion present. No focal airspace disease is present. The visualized soft tissues and bony thorax are unremarkable. IMPRESSION: 1. No acute cardiopulmonary disease. 2. Chronic interstitial coarsening. Electronically Signed   By: Marin Roberts M.D.   On: 08/06/2022 14:18    Procedures Procedures    Medications Ordered in  ED Medications  ibuprofen (ADVIL) tablet 800 mg (800 mg Oral Given 08/06/22 1421)  methocarbamol (ROBAXIN) tablet 500 mg (500 mg Oral Given 08/06/22 1658)  morphine (PF) 4 MG/ML injection 4 mg (4 mg Intravenous Given 08/06/22 1730)    ED Course/ Medical Decision Making/ A&P                           Medical Decision Making Amount and/or Complexity of Data Reviewed Labs: ordered.  Risk Prescription drug management.   BP 123/73 (BP Location: Right Arm)   Pulse 76   Temp 98.3 F (36.8 C) (Oral)   Resp 18   Ht 6\' 3"  (1.905 m)   Wt 113.4 kg   SpO2 100%   BMI 31.25 kg/m   2:45 PM 61 year old male significant history of esophageal dysphagia, GERD, OSA presenting with complaint of back pain.  Patient developed pain to his left side upper back radiates towards his left shoulder and arm that started earlier today.  Described pain more as a cramping sharp sensation that has been waxing waning but not fully resolved.  Pain felt different from usual back pain that he has had in the past.  No associated lightheadedness or dizziness no nausea vomiting diaphoresis or shortness of breath.  He denies any history of cardiac disease.  He denies tobacco or alcohol use.  He denies any recent strenuous activities or heavy lifting.  He did try taking some Tylenol earlier in the day without relief.  On exam this is a well-appearing male appears to be in no acute discomfort.  Heart and lung sounds normal abdomen is soft nontender.  He does not have any reproducible chest wall tenderness or back tenderness.  No midline spine tenderness.  He is afebrile with stable normal vital sign.  Initial EKG obtained without any arrhythmia or concerning ischemic changes.  Initial chest x-ray shows no acute cardiopulmonary disease but does show evidence of chronic interstitial coarsening.  X-ray was obtained independently viewed interpreted by me and I agree with radiology interpretation.  Additional blood work have been  ordered by me.  Morphine given for pain.  6:46 PM Labs, EKG, and imaging obtained independently viewed and interpreted by me and I agree with radiologist interpretation.  Fortunately EKG without concerning ischemic changes or abnormal arrhythmia.  No electrolyte imbalance, normal WBC, normal H&H, normal troponin.  Patient has a HEAR score of 2 low risk of Mace.  I have low suspicion for PE based on Wells criteria.  At this time patient is stable to be discharged home with outpatient follow-up.  Patient did receive ibuprofen and muscle relaxant with improvement of his symptoms.  Suspect  pain is likely MSK.  I gave patient strict return precaution but at this time he is stable to be discharged.  This patient presents to the ED for concern of back pain, this involves an extensive number of treatment options, and is a complaint that carries with it a high risk of complications and morbidity.  The differential diagnosis includes acs, msk, pna, ptx, shingle, gerd  Co morbidities that complicate the patient evaluation none Additional history obtained:  Additional history obtained from wife External records from outside source obtained and reviewed including EMR  Lab Tests:  I Ordered, and personally interpreted labs.  The pertinent results include:  as above  Imaging Studies ordered:  I ordered imaging studies including CXR I independently visualized and interpreted imaging which showed normal I agree with the radiologist interpretation  Cardiac Monitoring:  The patient was maintained on a cardiac monitor.  I personally viewed and interpreted the cardiac monitored which showed an underlying rhythm of: NSR  Medicines ordered and prescription drug management:  I ordered medication including morphine  for pain Reevaluation of the patient after these medicines showed that the patient improved I have reviewed the patients home medicines and have made adjustments as needed  Test  Considered: chest CTA  Critical Interventions: muscle relaxant  Opiate medication   Problem List / ED Course: upper back pain  Reevaluation:  After the interventions noted above, I reevaluated the patient and found that they have :improved  Social Determinants of Health: none  Dispostion:  After consideration of the diagnostic results and the patients response to treatment, I feel that the patent would benefit from outpt f/u.         Final Clinical Impression(s) / ED Diagnoses Final diagnoses:  Upper back pain on left side    Rx / DC Orders ED Discharge Orders          Ordered    ibuprofen (ADVIL) 600 MG tablet  Every 6 hours PRN        08/06/22 1854    cyclobenzaprine (FLEXERIL) 10 MG tablet  2 times daily PRN        08/06/22 1854              Domenic Moras, PA-C 08/06/22 1854    Cristie Hem, MD 08/17/22 [phone]

## 2022-08-26 ENCOUNTER — Encounter (INDEPENDENT_AMBULATORY_CARE_PROVIDER_SITE_OTHER): Payer: Self-pay | Admitting: Gastroenterology

## 2022-12-14 ENCOUNTER — Encounter: Payer: Self-pay | Admitting: Radiology
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
## 2023-03-13 ENCOUNTER — Ambulatory Visit: Payer: 59 | Admitting: Adult Health

## 2023-03-13 ENCOUNTER — Encounter: Payer: Self-pay | Admitting: Adult Health

## 2023-03-13 VITALS — BP 125/76 | HR 67 | Ht 75.0 in | Wt 252.0 lb

## 2023-03-13 DIAGNOSIS — G4733 Obstructive sleep apnea (adult) (pediatric): Secondary | ICD-10-CM

## 2023-03-13 NOTE — Patient Instructions (Addendum)
Continue using CPAP nightly and greater than 4 hours each night °If your symptoms worsen or you develop new symptoms please let us know.  ° °

## 2023-12-28 ENCOUNTER — Ambulatory Visit (HOSPITAL_COMMUNITY)
Admission: RE | Admit: 2023-12-28 | Discharge: 2023-12-28 | Disposition: A | Discharge status: Home / Self Care | Source: Ambulatory Visit | Attending: Family Medicine | Admitting: Family Medicine

## 2023-12-28 ENCOUNTER — Encounter: Payer: Self-pay | Admitting: Family Medicine

## 2023-12-28 ENCOUNTER — Ambulatory Visit: Admitting: Family Medicine

## 2023-12-28 VITALS — BP 132/74 | HR 69 | Temp 97.5°F | Ht 75.0 in | Wt 256.0 lb

## 2023-12-28 DIAGNOSIS — M545 Low back pain, unspecified: Secondary | ICD-10-CM | POA: Insufficient documentation

## 2023-12-28 DIAGNOSIS — R3911 Hesitancy of micturition: Secondary | ICD-10-CM

## 2023-12-28 LAB — URINALYSIS, ROUTINE W REFLEX MICROSCOPIC
Bacteria, UA: NONE SEEN /HPF
Bilirubin Urine: NEGATIVE
Glucose, UA: NEGATIVE
Hyaline Cast: NONE SEEN /LPF
Ketones, ur: NEGATIVE
Leukocytes,Ua: NEGATIVE
Nitrite: NEGATIVE
Protein, ur: NEGATIVE
Specific Gravity, Urine: 1.02 (ref 1.001–1.035)
Squamous Epithelial / HPF: NONE SEEN /HPF (ref <=5)
WBC, UA: NONE SEEN /HPF (ref 0–5)
pH: 7 (ref 5.0–8.0)

## 2023-12-28 LAB — MICROSCOPIC MESSAGE

## 2023-12-28 MED ORDER — MELOXICAM 15 MG PO TABS: 15.0000 mg | ORAL_TABLET | Freq: Every day | ORAL | 0 refills | Status: DC
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
## 2023-12-29 LAB — COMPLETE METABOLIC PANEL WITH GFR
AG Ratio: 1.9 (calc) (ref 1.0–2.5)
ALT: 48 U/L — ABNORMAL HIGH (ref 9–46)
AST: 25 U/L (ref 10–35)
Albumin: 4.5 g/dL (ref 3.6–5.1)
Alkaline phosphatase (APISO): 81 U/L (ref 35–144)
BUN: 11 mg/dL (ref 7–25)
CO2: 29 mmol/L (ref 20–32)
Calcium: 9.3 mg/dL (ref 8.6–10.3)
Chloride: 101 mmol/L (ref 98–110)
Creat: 1.12 mg/dL (ref 0.70–1.35)
Globulin: 2.4 g/dL (ref 1.9–3.7)
Glucose, Bld: 102 mg/dL — ABNORMAL HIGH (ref 65–99)
Potassium: 4.6 mmol/L (ref 3.5–5.3)
Sodium: 137 mmol/L (ref 135–146)
Total Bilirubin: 0.7 mg/dL (ref 0.2–1.2)
Total Protein: 6.9 g/dL (ref 6.1–8.1)
eGFR: 74 mL/min/{1.73_m2} (ref >=60)

## 2023-12-29 LAB — CBC WITH DIFFERENTIAL/PLATELET
Absolute Lymphocytes: 1113 {cells}/uL (ref 850–3900)
Absolute Monocytes: 428 {cells}/uL (ref 200–950)
Basophils Absolute: 38 {cells}/uL (ref 0–200)
Basophils Relative: 0.9 %
Eosinophils Absolute: 147 {cells}/uL (ref 15–500)
Eosinophils Relative: 3.5 %
HCT: 49.8 % (ref 38.5–50.0)
Hemoglobin: 17 g/dL (ref 13.2–17.1)
MCH: 32.3 pg (ref 27.0–33.0)
MCHC: 34.1 g/dL (ref 32.0–36.0)
MCV: 94.7 fL (ref 80.0–100.0)
MPV: 11.5 fL (ref 7.5–12.5)
Monocytes Relative: 10.2 %
Neutro Abs: 2474 {cells}/uL (ref 1500–7800)
Neutrophils Relative %: 58.9 %
Platelets: 205 10*3/uL (ref 140–400)
RBC: 5.26 10*6/uL (ref 4.20–5.80)
RDW: 12 % (ref 11.0–15.0)
Total Lymphocyte: 26.5 %
WBC: 4.2 10*3/uL (ref 3.8–10.8)

## 2023-12-29 LAB — PSA: PSA: 0.91 ng/mL (ref <=4.00)

## 2024-01-16 ENCOUNTER — Other Ambulatory Visit

## 2024-01-16 DIAGNOSIS — M545 Low back pain, unspecified: Secondary | ICD-10-CM

## 2024-01-16 DIAGNOSIS — R3911 Hesitancy of micturition: Secondary | ICD-10-CM

## 2024-01-16 DIAGNOSIS — Z13220 Encounter for screening for lipoid disorders: Secondary | ICD-10-CM

## 2024-01-17 LAB — LIPID PANEL
Cholesterol: 205 mg/dL — ABNORMAL HIGH (ref <200)
HDL: 45 mg/dL (ref >=40)
LDL Cholesterol (Calc): 139 mg/dL — ABNORMAL HIGH
Non-HDL Cholesterol (Calc): 160 mg/dL — ABNORMAL HIGH (ref <130)
Total CHOL/HDL Ratio: 4.6 (calc) (ref <5.0)
Triglycerides: 100 mg/dL (ref <150)

## 2024-01-17 LAB — URINALYSIS, ROUTINE W REFLEX MICROSCOPIC
Bilirubin Urine: NEGATIVE
Glucose, UA: NEGATIVE
Hgb urine dipstick: NEGATIVE
Ketones, ur: NEGATIVE
Leukocytes,Ua: NEGATIVE
Nitrite: NEGATIVE
Protein, ur: NEGATIVE
Specific Gravity, Urine: 1.016 (ref 1.001–1.035)
pH: 6.5 (ref 5.0–8.0)

## 2024-03-06 ENCOUNTER — Encounter: Payer: Self-pay | Admitting: *Deleted
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
## 2024-03-11 ENCOUNTER — Telehealth (INDEPENDENT_AMBULATORY_CARE_PROVIDER_SITE_OTHER): Payer: 59 | Admitting: Adult Health

## 2024-03-11 DIAGNOSIS — G4733 Obstructive sleep apnea (adult) (pediatric): Secondary | ICD-10-CM

## 2024-03-11 NOTE — Patient Instructions (Signed)
 Continue using CPAP nightly and greater than 4 hours each night Home sleep test ordered If your symptoms worsen or you develop new symptoms please let us know.

## 2024-03-21 ENCOUNTER — Ambulatory Visit: Admitting: Neurology

## 2024-03-21 DIAGNOSIS — G4733 Obstructive sleep apnea (adult) (pediatric): Secondary | ICD-10-CM

## 2024-04-01 NOTE — Progress Notes (Unsigned)
 Dalton Bradley

## 2024-04-04 NOTE — Procedures (Signed)
 [facility]  HOME SLEEP TEST (SANSA) REPORT (Mail-Out Device):   STUDY DATE: 03/20/2024  DOB: 1961-08-21  MRN: [id number]  ORDERING CLINICIAN: Debbra Fairy, MD, PhD   REFERRING CLINICIAN: Colby Daub, NP  CLINICAL INFORMATION/HISTORY: 63 year old male with an underlying medical history of reflux disease, back pain, and mild obesity, who was previously diagnosed with obstructive sleep apnea and placed on CPAP therapy. He is compliant with his CPAP of 8 cm with EPR of 3 with good tolerance and good apnea control.  He has benefited from treatment and should be eligible for new equipment.  BMI (at the time of sleep clinic visit and/or test date): 31.5 kg/m  FINDINGS:   Study Protocol:    The SANSA single-point-of-skin-contact chest-worn sensor - an FDA cleared and DOT approved type 4 home sleep test device - measures eight physiological channels,  including blood oxygen saturation (measured via PPG [photoplethysmography]), EKG-derived heart rate, respiratory effort, chest movement (measured via accelerometer), snoring, body position, and actigraphy. The device is designed to be worn for up to 10 hours per study.   Sleep Summary:   Total Recording Time (hours, min): 7 hours, 57 min  Total Effective Sleep Time (hours, min):  6 hours, 47 min  Sleep Efficiency (%):    85%   Respiratory Indices:   Calculated sAHI (per hour):  15.6/hour         Oxygen Saturation Statistics:    Oxygen Saturation (%) Mean: 95.7%   Minimum oxygen saturation (%):                 87%   O2 Saturation Range (%): 87-99.9%   Time below or at 88% saturation: 0 min   Pulse Rate Statistics:   Pulse Mean (bpm):    53/min    Pulse Range (47-92/min)   Snoring: Intermittent, mild to moderate  IMPRESSION/DIAGNOSES:   OSA (obstructive sleep apnea), moderate   RECOMMENDATIONS:   This home sleep test demonstrates moderate obstructive sleep apnea with a total AHI of 15.6/hour and  O2 nadir of 87%.  Intermittent mild to moderate snoring was detected.  Ongoing treatment with a positive airway pressure (PAP) device is recommended. The patient has been compliant with his CPAP of 8 cm with EPR of 3 with good tolerance and good apnea control.  He has benefited from treatment and should be eligible for new equipment.  I recommend continuing with CPAP therapy at the current settings and a new CPAP machine with an inbuilt modem, mask of choice, sized to fit. A full night titration study may be considered to optimize treatment settings, monitor proper oxygen saturations and aid with improvement of tolerance and adherence, if needed down the road. Alternative treatment options may include a dental device through dentistry or orthodontics in selected patients or Inspire (hypoglossal nerve stimulator) in carefully selected patients (meeting inclusion criteria).  Concomitant weight loss is recommended (where clinically appropriate). Please note that untreated obstructive sleep apnea may carry additional perioperative morbidity. Patients with significant obstructive sleep apnea should receive perioperative PAP therapy and the surgeons and particularly the anesthesiologist should be informed of the diagnosis and the severity of the sleep disordered breathing. The patient should be cautioned not to drive, work at heights, or operate dangerous or heavy equipment when tired or sleepy. Review and reiteration of good sleep hygiene measures should be pursued with any patient. Other causes of the patient's symptoms, including circadian rhythm disturbances, an underlying mood disorder, medication effect and/or an underlying medical problem cannot  be ruled out based on this test. Clinical correlation is recommended.  The patient and his referring provider will be notified of the test results. The patient will be seen in follow up in sleep clinic at [facility].  I certify that I have reviewed the raw data recording  prior to the issuance of this report in accordance with the standards of the American Academy of Sleep Medicine (AASM).    INTERPRETING PHYSICIAN:   Debbra Fairy, MD, PhD Medical Director, [facility]) Diplomat, ABPN (Neurology and Sleep)   [facility] [address], [address] [place], [place] [address] [phone]

## 2024-04-07 ENCOUNTER — Ambulatory Visit: Payer: Self-pay | Admitting: Adult Health

## 2024-04-07 DIAGNOSIS — G4733 Obstructive sleep apnea (adult) (pediatric): Secondary | ICD-10-CM

## 2024-04-07 NOTE — Telephone Encounter (Signed)
 Spoke to patient aware order for new pap machine will be sent this afternoon Pt will stay with [facility] has DME Made f/u appointment with Megan,NP 05/2024 .Pt thanked me for calling

## 2024-04-07 NOTE — Telephone Encounter (Signed)
-----   Message from Duwaine Russell sent at 04/07/2024  1:06 PM EDT ----- Order placed ----- Message ----- From: Buck Saucer, MD Sent: 04/04/2024   3:14 PM EDT To: Duwaine Russell, NP

## 2024-04-25 ENCOUNTER — Ambulatory Visit: Payer: Self-pay

## 2024-04-25 ENCOUNTER — Other Ambulatory Visit: Payer: Self-pay | Admitting: Family Medicine

## 2024-04-25 MED ORDER — ALPRAZOLAM 0.5 MG PO TABS: 0.5000 mg | ORAL_TABLET | Freq: Two times a day (BID) | ORAL | 3 refills | Status: AC | PRN

## 2024-04-25 NOTE — Telephone Encounter (Signed)
 FYI Only or Action Required?: Action required by provider: medication refill request.  Patient was last seen in primary care on 12/28/2023 by Duanne Butler DASEN, MD.  Called Nurse Triage reporting Anxiety.  Symptoms began several days ago.  Interventions attempted: Prescription medications: Xanax  but pt is completely out of medication.  Symptoms are: stable.  Triage Disposition: Call PCP Now (overriding Home Care)  Patient/caregiver understands and will follow disposition?: Yes Pt states he has had anxiety attacks more at night and only twice this week but he is completely out of Xanax . Pt still taking small dose of prescribed med d/t he doesn't like taking medications. Pt asking for refill, willing to come in if needed, advised pt no appts today and pt would like to get refill asap if possible. Advised I would send message to provider and have nurse FU with him.   Copied from CRM [id number]. Topic: Clinical - Red Word Triage >> Apr 25, 2024  2:23 PM Antwanette L wrote: Red Word that prompted transfer to Nurse Triage: patient is experiencing anxiety at night and difficulty sleeping Reason for Disposition  MILD anxiety symptoms (e.g., anxiety symptoms are mild and intermittent; symptoms do not interfere with daily activities)  Answer Assessment - Initial Assessment Questions 1. CONCERN: Did anything happen that prompted you to call today?      Having anxiety and difficulty sleeping  2. ANXIETY SYMPTOMS: Can you describe how you (your loved one; patient) have been feeling? (e.g., tense, restless, panicky, anxious, keyed up, overwhelmed, sense of impending doom).      Restless and anxious  3. ONSET: How long have you been feeling this way? (e.g., hours, days, weeks)     Twice in this week  4. SEVERITY: How would you rate the level of anxiety? (e.g., 0 - 10; or mild, moderate, severe).     Mild  5. FUNCTIONAL IMPAIRMENT: How have these feelings affected your ability to do daily  activities? Have you had more difficulty than usual doing your normal daily activities? (e.g., getting better, same, worse; self-care, school, work, interactions)     Not a lot  6. HISTORY: Have you felt this way before? Have you ever been diagnosed with an anxiety problem in the past? (e.g., generalized anxiety disorder, panic attacks, PTSD). If Yes, ask: How was this problem treated? (e.g., medicines, counseling, etc.)     Xanax  8. TREATMENT:  What has been done so far to treat this anxiety? (e.g., medicines, relaxation strategies). What has helped?     Xanax  cuts dose in half  12. OTHER SYMPTOMS: Do you have any other symptoms? (e.g., feeling depressed, trouble concentrating, trouble sleeping, trouble breathing, palpitations or fast heartbeat, chest pain, sweating, nausea, or diarrhea)       Difficulty sleeping  Protocols used: Anxiety and Panic Attack-A-AH

## 2024-05-19 ENCOUNTER — Encounter: Payer: Self-pay | Admitting: *Deleted

## 2024-05-20 ENCOUNTER — Encounter: Admitting: Adult Health

## 2024-05-21 NOTE — Progress Notes (Signed)
 This encounter was created in error - please disregard.

## 2024-05-26 ENCOUNTER — Encounter: Payer: Self-pay | Admitting: *Deleted

## 2024-05-27 ENCOUNTER — Telehealth: Admitting: Adult Health

## 2024-05-27 DIAGNOSIS — G4733 Obstructive sleep apnea (adult) (pediatric): Secondary | ICD-10-CM | POA: Diagnosis not present

## 2024-05-27 NOTE — Patient Instructions (Signed)
 Continue using CPAP nightly and greater than 4 hours each night If your symptoms worsen or you develop new symptoms please let us  know.
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
## 2024-07-30 ENCOUNTER — Encounter (INDEPENDENT_AMBULATORY_CARE_PROVIDER_SITE_OTHER): Payer: Self-pay | Admitting: Gastroenterology

## 2024-09-17 ENCOUNTER — Other Ambulatory Visit: Payer: Self-pay

## 2024-09-17 ENCOUNTER — Encounter (HOSPITAL_COMMUNITY): Admission: EM | Disposition: A | Payer: Self-pay | Source: Home / Self Care | Attending: Cardiology

## 2024-09-17 ENCOUNTER — Encounter (HOSPITAL_COMMUNITY): Payer: Self-pay

## 2024-09-17 ENCOUNTER — Inpatient Hospital Stay (HOSPITAL_COMMUNITY)
Admission: EM | Admit: 2024-09-17 | Discharge: 2024-09-18 | DRG: 322 | Disposition: A | Attending: Cardiology | Admitting: Cardiology

## 2024-09-17 ENCOUNTER — Emergency Department (HOSPITAL_COMMUNITY)

## 2024-09-17 ENCOUNTER — Inpatient Hospital Stay (HOSPITAL_COMMUNITY)

## 2024-09-17 DIAGNOSIS — I493 Ventricular premature depolarization: Secondary | ICD-10-CM | POA: Insufficient documentation

## 2024-09-17 DIAGNOSIS — K219 Gastro-esophageal reflux disease without esophagitis: Secondary | ICD-10-CM | POA: Diagnosis present

## 2024-09-17 DIAGNOSIS — I249 Acute ischemic heart disease, unspecified: Principal | ICD-10-CM

## 2024-09-17 DIAGNOSIS — E785 Hyperlipidemia, unspecified: Secondary | ICD-10-CM | POA: Insufficient documentation

## 2024-09-17 DIAGNOSIS — I2121 ST elevation (STEMI) myocardial infarction involving left circumflex coronary artery: Secondary | ICD-10-CM | POA: Diagnosis present

## 2024-09-17 DIAGNOSIS — Z955 Presence of coronary angioplasty implant and graft: Secondary | ICD-10-CM

## 2024-09-17 HISTORY — PX: LEFT HEART CATH AND CORONARY ANGIOGRAPHY: CATH118249

## 2024-09-17 HISTORY — PX: CORONARY STENT INTERVENTION: CATH118234

## 2024-09-17 LAB — COMPREHENSIVE METABOLIC PANEL WITH GFR
ALT: 36 U/L (ref 0–44)
AST: 28 U/L (ref 15–41)
Albumin: 4.2 g/dL (ref 3.5–5.0)
Alkaline Phosphatase: 95 U/L (ref 38–126)
Anion gap: 13 (ref 5–15)
BUN: 10 mg/dL (ref 8–23)
CO2: 25 mmol/L (ref 22–32)
Calcium: 8.9 mg/dL (ref 8.9–10.3)
Chloride: 99 mmol/L (ref 98–111)
Creatinine, Ser: 1.09 mg/dL (ref 0.61–1.24)
GFR, Estimated: >60 mL/min (ref >60)
Glucose, Bld: 142 mg/dL — ABNORMAL HIGH (ref 70–99)
Potassium: 3.5 mmol/L (ref 3.5–5.1)
Sodium: 137 mmol/L (ref 135–145)
Total Bilirubin: 0.6 mg/dL (ref 0.0–1.2)
Total Protein: 7.1 g/dL (ref 6.5–8.1)

## 2024-09-17 LAB — CBC WITH DIFFERENTIAL/PLATELET
Abs Immature Granulocytes: 0.01 K/uL (ref 0.00–0.07)
Basophils Absolute: 0.1 K/uL (ref 0.0–0.1)
Basophils Relative: 1 %
Eosinophils Absolute: 0.3 K/uL (ref 0.0–0.5)
Eosinophils Relative: 5 %
HCT: 48.6 % (ref 39.0–52.0)
Hemoglobin: 16.8 g/dL (ref 13.0–17.0)
Immature Granulocytes: 0 %
Lymphocytes Relative: 26 %
Lymphs Abs: 1.4 K/uL (ref 0.7–4.0)
MCH: 32.6 pg (ref 26.0–34.0)
MCHC: 34.6 g/dL (ref 30.0–36.0)
MCV: 94.4 fL (ref 80.0–100.0)
Monocytes Absolute: 0.5 K/uL (ref 0.1–1.0)
Monocytes Relative: 9 %
Neutro Abs: 3.1 K/uL (ref 1.7–7.7)
Neutrophils Relative %: 59 %
Platelets: 215 K/uL (ref 150–400)
RBC: 5.15 MIL/uL (ref 4.22–5.81)
RDW: 12.3 % (ref 11.5–15.5)
WBC: 5.3 K/uL (ref 4.0–10.5)
nRBC: 0 % (ref 0.0–0.2)

## 2024-09-17 LAB — ECHOCARDIOGRAM COMPLETE
Area-P 1/2: 3.21 cm2
Calc EF: 64.7 %
Height: 75 in
S' Lateral: 3 cm
Single Plane A2C EF: 63.5 %
Single Plane A4C EF: 66.2 %
Weight: 4160 [oz_av]

## 2024-09-17 LAB — LIPID PANEL
Cholesterol: 198 mg/dL (ref 0–200)
HDL: 38 mg/dL — ABNORMAL LOW (ref >40)
LDL Cholesterol: 136 mg/dL — ABNORMAL HIGH (ref 0–99)
Total CHOL/HDL Ratio: 5.2 ratio
Triglycerides: 121 mg/dL (ref <150)
VLDL: 24 mg/dL (ref 0–40)

## 2024-09-17 LAB — TROPONIN I (HIGH SENSITIVITY): Troponin I (High Sensitivity): >24000 ng/L (ref <18)

## 2024-09-17 LAB — HEMOGLOBIN A1C
Hgb A1c MFr Bld: 5.3 % (ref 4.8–5.6)
Mean Plasma Glucose: 105 mg/dL

## 2024-09-17 LAB — CG4 I-STAT (LACTIC ACID): Lactic Acid, Venous: 2.2 mmol/L (ref 0.5–1.9)

## 2024-09-17 LAB — PROTIME-INR
INR: 1 (ref 0.8–1.2)
Prothrombin Time: 14.2 s (ref 11.4–15.2)

## 2024-09-17 LAB — TROPONIN T, HIGH SENSITIVITY: Troponin T High Sensitivity: 60 ng/L — ABNORMAL HIGH (ref 0–19)

## 2024-09-17 LAB — MRSA NEXT GEN BY PCR, NASAL: MRSA by PCR Next Gen: NOT DETECTED

## 2024-09-17 LAB — APTT: aPTT: 24 s (ref 24–36)

## 2024-09-17 LAB — POCT ACTIVATED CLOTTING TIME: Activated Clotting Time: 440 s

## 2024-09-17 LAB — LACTIC ACID, PLASMA: Lactic Acid, Venous: 3.3 mmol/L (ref 0.5–1.9)

## 2024-09-17 LAB — HIV ANTIBODY (ROUTINE TESTING W REFLEX): HIV Screen 4th Generation wRfx: NONREACTIVE

## 2024-09-17 SURGERY — LEFT HEART CATH AND CORONARY ANGIOGRAPHY
Anesthesia: LOCAL

## 2024-09-17 MED ORDER — SODIUM CHLORIDE 0.9 % IV SOLN: 250.0000 mL | INTRAVENOUS | Status: AC | PRN

## 2024-09-17 MED ORDER — FENTANYL CITRATE (PF) 100 MCG/2ML IJ SOLN
INTRAMUSCULAR | Status: AC
  Filled 2024-09-17: qty 2

## 2024-09-17 MED ORDER — VERAPAMIL HCL 2.5 MG/ML IV SOLN
INTRAVENOUS | Status: DC | PRN
  Administered 2024-09-17: 10 mL via INTRA_ARTERIAL

## 2024-09-17 MED ORDER — ENOXAPARIN SODIUM 40 MG/0.4ML IJ SOSY: 40.0000 mg | PREFILLED_SYRINGE | INTRAMUSCULAR | Status: DC

## 2024-09-17 MED ORDER — IOHEXOL 350 MG/ML SOLN
INTRAVENOUS | Status: DC | PRN
  Administered 2024-09-17: 160 mL

## 2024-09-17 MED ORDER — MIDAZOLAM HCL (PF) 2 MG/2ML IJ SOLN
INTRAMUSCULAR | Status: DC | PRN
  Administered 2024-09-17 (×2): 1 mg via INTRAVENOUS

## 2024-09-17 MED ORDER — NITROGLYCERIN 1 MG/10 ML FOR IR/CATH LAB
INTRA_ARTERIAL | Status: AC
  Filled 2024-09-17: qty 10

## 2024-09-17 MED ORDER — TICAGRELOR 90 MG PO TABS
90.0000 mg | ORAL_TABLET | Freq: Two times a day (BID) | ORAL | Status: DC
  Administered 2024-09-17 – 2024-09-18 (×2): 90 mg via ORAL
  Filled 2024-09-17 (×2): qty 1

## 2024-09-17 MED ORDER — ROSUVASTATIN CALCIUM 20 MG PO TABS
20.0000 mg | ORAL_TABLET | Freq: Every day | ORAL | Status: DC
  Administered 2024-09-17: 20 mg via ORAL
  Filled 2024-09-17: qty 1

## 2024-09-17 MED ORDER — NITROGLYCERIN 0.4 MG SL SUBL: 0.4000 mg | SUBLINGUAL_TABLET | SUBLINGUAL | Status: DC | PRN

## 2024-09-17 MED ORDER — MORPHINE SULFATE (PF) 4 MG/ML IV SOLN: 4.0000 mg | Freq: Once | INTRAVENOUS | Status: DC | PRN

## 2024-09-17 MED ORDER — HEPARIN (PORCINE) IN NACL 1000-0.9 UT/500ML-% IV SOLN
INTRAVENOUS | Status: DC | PRN
  Administered 2024-09-17 (×2): 500 mL

## 2024-09-17 MED ORDER — MIDAZOLAM HCL 2 MG/2ML IJ SOLN
INTRAMUSCULAR | Status: AC
  Filled 2024-09-17: qty 2

## 2024-09-17 MED ORDER — ORAL CARE MOUTH RINSE: 15.0000 mL | OROMUCOSAL | Status: DC | PRN

## 2024-09-17 MED ORDER — SODIUM CHLORIDE 0.9 % IV SOLN: INTRAVENOUS | Status: AC

## 2024-09-17 MED ORDER — FENTANYL CITRATE (PF) 100 MCG/2ML IJ SOLN
INTRAMUSCULAR | Status: DC | PRN
  Administered 2024-09-17: 50 ug via INTRAVENOUS
  Administered 2024-09-17: 25 ug via INTRAVENOUS

## 2024-09-17 MED ORDER — CHLORHEXIDINE GLUCONATE CLOTH 2 % EX PADS
6.0000 | MEDICATED_PAD | Freq: Every day | CUTANEOUS | Status: DC
  Administered 2024-09-17 – 2024-09-18 (×2): 6 via TOPICAL

## 2024-09-17 MED ORDER — ASPIRIN 81 MG PO CHEW
324.0000 mg | CHEWABLE_TABLET | Freq: Once | ORAL | Status: AC
  Administered 2024-09-17: 324 mg via ORAL
  Filled 2024-09-17: qty 4

## 2024-09-17 MED ORDER — SODIUM CHLORIDE 0.9% FLUSH: 3.0000 mL | INTRAVENOUS | Status: DC | PRN

## 2024-09-17 MED ORDER — ACETAMINOPHEN 325 MG PO TABS: 650.0000 mg | ORAL_TABLET | ORAL | Status: DC | PRN

## 2024-09-17 MED ORDER — VERAPAMIL HCL 2.5 MG/ML IV SOLN
INTRAVENOUS | Status: AC
  Filled 2024-09-17: qty 2

## 2024-09-17 MED ORDER — ONDANSETRON HCL 4 MG/2ML IJ SOLN
4.0000 mg | Freq: Once | INTRAMUSCULAR | Status: AC
  Administered 2024-09-17: 4 mg via INTRAVENOUS
  Filled 2024-09-17: qty 2

## 2024-09-17 MED ORDER — LIDOCAINE HCL (PF) 1 % IJ SOLN
INTRAMUSCULAR | Status: AC
  Filled 2024-09-17: qty 30

## 2024-09-17 MED ORDER — SODIUM CHLORIDE 0.9% FLUSH
3.0000 mL | Freq: Two times a day (BID) | INTRAVENOUS | Status: DC
  Administered 2024-09-17 – 2024-09-18 (×3): 3 mL via INTRAVENOUS

## 2024-09-17 MED ORDER — TICAGRELOR 90 MG PO TABS
ORAL_TABLET | ORAL | Status: DC | PRN
  Administered 2024-09-17: 180 mg via ORAL

## 2024-09-17 MED ORDER — TICAGRELOR 90 MG PO TABS
ORAL_TABLET | ORAL | Status: AC
  Filled 2024-09-17: qty 2

## 2024-09-17 MED ORDER — ONDANSETRON HCL 4 MG/2ML IJ SOLN: 4.0000 mg | Freq: Four times a day (QID) | INTRAMUSCULAR | Status: DC | PRN

## 2024-09-17 MED ORDER — METOPROLOL SUCCINATE ER 25 MG PO TB24
25.0000 mg | ORAL_TABLET | Freq: Every day | ORAL | Status: DC
  Administered 2024-09-17 – 2024-09-18 (×2): 25 mg via ORAL
  Filled 2024-09-17 (×2): qty 1

## 2024-09-17 MED ORDER — ENOXAPARIN SODIUM 40 MG/0.4ML IJ SOSY
40.0000 mg | PREFILLED_SYRINGE | INTRAMUSCULAR | Status: DC
  Administered 2024-09-18: 40 mg via SUBCUTANEOUS
  Filled 2024-09-17: qty 0.4

## 2024-09-17 MED ORDER — ASPIRIN 81 MG PO TBEC
81.0000 mg | DELAYED_RELEASE_TABLET | Freq: Every day | ORAL | Status: DC
  Administered 2024-09-18: 81 mg via ORAL
  Filled 2024-09-17: qty 1

## 2024-09-17 MED ORDER — HEPARIN SODIUM (PORCINE) 1000 UNIT/ML IJ SOLN
INTRAMUSCULAR | Status: DC | PRN
  Administered 2024-09-17: 10000 [IU] via INTRAVENOUS

## 2024-09-17 MED ORDER — FREE WATER
500.0000 mL | Freq: Once | Status: AC
  Administered 2024-09-17: 500 mL via ORAL

## 2024-09-17 MED ORDER — HEPARIN SODIUM (PORCINE) 1000 UNIT/ML IJ SOLN
INTRAMUSCULAR | Status: AC
  Filled 2024-09-17: qty 10

## 2024-09-17 MED ORDER — NITROGLYCERIN 1 MG/10 ML FOR IR/CATH LAB
INTRA_ARTERIAL | Status: DC | PRN
  Administered 2024-09-17: 200 ug via INTRACORONARY

## 2024-09-17 MED ORDER — HEPARIN SODIUM (PORCINE) 5000 UNIT/ML IJ SOLN
4000.0000 [IU] | Freq: Once | INTRAMUSCULAR | Status: AC
  Administered 2024-09-17: 4000 [IU] via INTRAVENOUS
  Filled 2024-09-17: qty 1

## 2024-09-17 MED ORDER — LIDOCAINE HCL (PF) 1 % IJ SOLN
INTRAMUSCULAR | Status: DC | PRN
  Administered 2024-09-17: 5 mL via INTRADERMAL

## 2024-09-17 SURGICAL SUPPLY — 14 items
BALLOON EMERGE MR 2.0X12 (BALLOONS) IMPLANT
BALLOON ~~LOC~~ EUPHORA RX 3.25X12 (BALLOONS) IMPLANT
CATH 5FR JL3.5 JR4 ANG PIG MP (CATHETERS) IMPLANT
CATH LAUNCHER 6FR EBU3.5 (CATHETERS) IMPLANT
DEVICE RAD TR BAND REGULAR (VASCULAR PRODUCTS) IMPLANT
GLIDESHEATH SLEND SS 6F .021 (SHEATH) IMPLANT
GUIDEWIRE INQWIRE 1.5J.035X260 (WIRE) IMPLANT
KIT ENCORE 26 ADVANTAGE (KITS) IMPLANT
KIT SYRINGE INJ CVI SPIKEX1 (MISCELLANEOUS) IMPLANT
PACK CARDIAC CATHETERIZATION (CUSTOM PROCEDURE TRAY) ×1 IMPLANT
SET ATX-X65L (MISCELLANEOUS) IMPLANT
SHEATH PROBE COVER 6X72 (BAG) IMPLANT
STENT SYNERGY XD 3.0X16 (Permanent Stent) IMPLANT
WIRE ASAHI PROWATER 180CM (WIRE) IMPLANT
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
## 2024-09-17 NOTE — Progress Notes (Signed)
  Echocardiogram 2D Echocardiogram has been performed.  Trysten Berti 09/17/2024, 9:43 AM

## 2024-09-17 NOTE — Progress Notes (Signed)
 Wife is in waiting room. Signed up for text messages. Will check in with volunteers at 8am.   EFFIE POWELL HERO, RN

## 2024-09-17 NOTE — ED Notes (Addendum)
Pt changed out of personal clothing and into hospital gown.

## 2024-09-17 NOTE — ED Triage Notes (Signed)
 Pt c/o chest pain since last night and awoke this morning with same pain. Hx of anxiety. States he got real sweaty and clammy like he was going to pass out. Pt wife gave him anxiety pill. Pain, 6/10 in center of chest.

## 2024-09-17 NOTE — ED Notes (Addendum)
 Zoll Monitor pads applied to patient.

## 2024-09-17 NOTE — TOC CM/SW Note (Signed)
 Transition of Care [facility]) - Inpatient Brief Assessment   Patient Details  Name: Dalton Bradley MRN: [id number] Date of Birth: 1961-05-10  Transition of [facility]) CM/SW Contact:    Sudie Erminio Deems, RN Phone Number: 09/17/2024, 3:46 PM   Clinical Narrative: Patient presented for chest pain-plan for LHC. PTA patient states he was independent from home with spouse and son. Daughter was att he bedside during the visit. Patient states he does not use any DME in the home. Patient has insurance and PCP-has no issues with transportation. ICM will continue to follow for additional needs as the patient progresses.    Transition of Care Asessment: Insurance and Status: Insurance coverage has been reviewed Patient has primary care physician: Yes Home environment has been reviewed: reviewed Prior level of function:: independent Prior/Current Home Services: No current home services Social Drivers of Health Review: SDOH reviewed no interventions necessary Readmission risk has been reviewed: Yes Transition of care needs: no transition of care needs at this time
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
## 2024-09-17 NOTE — ED Notes (Signed)
 [facility] called for Emergent transport to [facility] Cath Lab

## 2024-09-18 ENCOUNTER — Telehealth: Payer: Self-pay | Admitting: Cardiology

## 2024-09-18 ENCOUNTER — Other Ambulatory Visit (HOSPITAL_COMMUNITY): Payer: Self-pay

## 2024-09-18 ENCOUNTER — Encounter (HOSPITAL_COMMUNITY): Payer: Self-pay | Admitting: Cardiology

## 2024-09-18 ENCOUNTER — Telehealth (HOSPITAL_COMMUNITY): Payer: Self-pay

## 2024-09-18 DIAGNOSIS — I493 Ventricular premature depolarization: Secondary | ICD-10-CM | POA: Insufficient documentation

## 2024-09-18 DIAGNOSIS — E785 Hyperlipidemia, unspecified: Secondary | ICD-10-CM | POA: Insufficient documentation

## 2024-09-18 LAB — CBC
HCT: 49.1 % (ref 39.0–52.0)
Hemoglobin: 16.5 g/dL (ref 13.0–17.0)
MCH: 31.5 pg (ref 26.0–34.0)
MCHC: 33.6 g/dL (ref 30.0–36.0)
MCV: 93.7 fL (ref 80.0–100.0)
Platelets: 203 K/uL (ref 150–400)
RBC: 5.24 MIL/uL (ref 4.22–5.81)
RDW: 12.6 % (ref 11.5–15.5)
WBC: 8.5 K/uL (ref 4.0–10.5)
nRBC: 0 % (ref 0.0–0.2)

## 2024-09-18 LAB — CG4 I-STAT (LACTIC ACID): Lactic Acid, Venous: 1.2 mmol/L (ref 0.5–1.9)

## 2024-09-18 LAB — BASIC METABOLIC PANEL WITH GFR
Anion gap: 9 (ref 5–15)
BUN: 10 mg/dL (ref 8–23)
CO2: 25 mmol/L (ref 22–32)
Calcium: 8.9 mg/dL (ref 8.9–10.3)
Chloride: 102 mmol/L (ref 98–111)
Creatinine, Ser: 1.15 mg/dL (ref 0.61–1.24)
GFR, Estimated: >60 mL/min (ref >60)
Glucose, Bld: 105 mg/dL — ABNORMAL HIGH (ref 70–99)
Potassium: 4.2 mmol/L (ref 3.5–5.1)
Sodium: 136 mmol/L (ref 135–145)

## 2024-09-18 LAB — TROPONIN T: Troponin T (Highly Sensitive): 4274 ng/L — ABNORMAL HIGH (ref 0–22)

## 2024-09-18 LAB — HEMOGLOBIN A1C
Hgb A1c MFr Bld: 5.3 % (ref 4.8–5.6)
Mean Plasma Glucose: 105 mg/dL

## 2024-09-18 MED ORDER — ROSUVASTATIN CALCIUM 20 MG PO TABS
40.0000 mg | ORAL_TABLET | Freq: Every day | ORAL | Status: DC
  Administered 2024-09-18: 40 mg via ORAL
  Filled 2024-09-18: qty 2

## 2024-09-18 MED ORDER — ROSUVASTATIN CALCIUM 40 MG PO TABS
40.0000 mg | ORAL_TABLET | Freq: Every day | ORAL | 1 refills | Status: DC
  Filled 2024-09-18: qty 90, 90d supply, fill #0

## 2024-09-18 MED ORDER — METOPROLOL SUCCINATE ER 25 MG PO TB24
25.0000 mg | ORAL_TABLET | Freq: Every day | ORAL | 1 refills | Status: DC
  Filled 2024-09-18: qty 90, 90d supply, fill #0

## 2024-09-18 MED ORDER — TICAGRELOR 90 MG PO TABS
90.0000 mg | ORAL_TABLET | Freq: Two times a day (BID) | ORAL | 2 refills | Status: DC
  Filled 2024-09-18: qty 60, 30d supply, fill #0

## 2024-09-18 MED ORDER — ASPIRIN 81 MG PO TBEC
81.0000 mg | DELAYED_RELEASE_TABLET | Freq: Every day | ORAL | 2 refills | Status: AC
  Filled 2024-09-18: qty 90, 90d supply, fill #0

## 2024-09-18 MED ORDER — NITROGLYCERIN 0.4 MG SL SUBL
0.4000 mg | SUBLINGUAL_TABLET | SUBLINGUAL | 2 refills | Status: AC | PRN
  Filled 2024-09-18: qty 25, 5d supply, fill #0

## 2024-09-18 MED ORDER — PANTOPRAZOLE SODIUM 40 MG PO TBEC
40.0000 mg | DELAYED_RELEASE_TABLET | Freq: Every day | ORAL | Status: DC
  Administered 2024-09-18: 40 mg via ORAL
  Filled 2024-09-18: qty 1

## 2024-09-18 MED ORDER — PANTOPRAZOLE SODIUM 40 MG PO TBEC
40.0000 mg | DELAYED_RELEASE_TABLET | Freq: Every day | ORAL | 0 refills | Status: DC
  Filled 2024-09-18: qty 90, 90d supply, fill #0

## 2024-09-18 NOTE — Telephone Encounter (Signed)
   Transition of Care Follow-up Phone Call Request    Patient Name: [name] Date of Birth: 1961/09/07 Date of Encounter: 09/18/2024  Primary Care Provider:  Duanne Butler DASEN, MD Primary Cardiologist:  Peter Jordan, MD  Dalton Bradley has been scheduled for a transition of care follow up appointment with a Heart[facility] provider:  Laymon Qua 12/16  Please reach out to Dalton Bradley within 48 hours of discharge to confirm appointment and review transition of care protocol questionnaire. Anticipated discharge date: 12/4  Manuelita Rummer, NP  09/18/2024, 11:43 AM

## 2024-09-18 NOTE — Progress Notes (Signed)
  Progress Note  Patient Name: Dalton Bradley Date of Encounter: 09/18/2024 [facility] Cardiologist: Peter Jordan, MD   Interval Summary   Status post PCI of obtuse marginal due to lateral STEMI.  No acute events overnight.  Patient doing well, ambulating without issues  Vital Signs Vitals:   09/18/24 0344 09/18/24 0400 09/18/24 0500 09/18/24 0600  BP:  108/73 109/75   Pulse:  (!) 57 (!) 56 (!) 55  Resp:  (!) 24 20 13   Temp: 98.1 F (36.7 C)     TempSrc: Oral     SpO2:  96% 96% 97%  Weight:      Height:        Intake/Output Summary (Last 24 hours) at 09/18/2024 0700 Last data filed at 09/18/2024 0000 Gross per 24 hour  Intake 980 ml  Output 500 ml  Net 480 ml      09/17/2024    6:20 AM 12/28/2023    9:12 AM 03/13/2023   10:38 AM  Last 3 Weights  Weight (lbs) 260 lb   260 lb 256 lb 252 lb  Weight (kg) 117.935 kg   117.935 kg 116.121 kg 114.306 kg      Telemetry/ECG  Occasional AIVR and PVCs- Personally Reviewed  Physical Exam  GEN: No acute distress.   Neck: No JVD Cardiac: RRR, no murmurs, rubs, or gallops.  Respiratory: Clear to auscultation bilaterally. GI: Soft, nontender, non-distended  MS: No edema  Assessment & Plan  Acute coronary syndrome/lateral STEMI Continue aspirin 81 mg, ticagrelor 90 mg twice daily,  Toprol 25 mg daily, rosuvastatin with dose adjustment as below, as needed nitroglycerin TTE yesterday with preserved ejection fraction no significant valvular abnormalities Hyperlipidemia Increase rosuvastatin to 40 mg daily GERD History of esophageal dilatation previously Start Protonix 40 mg daily PVCs/AIVR Continue Toprol 25 mg Disposition Discharge later today with close hospital follow-up.  Patient would like to follow-up in [place].    For questions or updates, please contact [facility] Please consult [email or web address] for contact info under         Signed, Bonifacio Pruden K Halina Asano, MD

## 2024-09-18 NOTE — Progress Notes (Signed)
 CARDIAC REHAB PHASE I    Pt sitting in chair, feeling well today. Reports tolerating independent ambulation with no CP, dizziness or SOB. Post MI/stent education including restrictions, risk factors, exercise guidelines, antiplatelet therapy importance, MI booklet, NTG use, heart healthy diet and CRP2 reviewed. All questions and concerns addressed. Will refer to [facility] for CRP2. Plan for discharge home later today.   [id number] Dalton Asberry Hacking, RN BSN 09/18/2024 11:24 AM

## 2024-09-18 NOTE — Discharge Summary (Addendum)
 Discharge Summary   Patient ID: LEVELLE EDELEN MRN: [id number]; DOB: 07-Apr-1961  Admit date: 09/17/2024 Discharge date: 09/18/2024  PCP:  Duanne Butler DASEN, MD   [facility] Providers Cardiologist:  Peter Jordan, MD     Discharge Diagnoses  Principal Problem:   ST elevation myocardial infarction involving left circumflex coronary artery [facility]) Active Problems:   GERD (gastroesophageal reflux disease)   STEMI involving left circumflex coronary artery (HCC)   Hyperlipidemia   PVC (premature ventricular contraction)   Diagnostic Studies/Procedures   Cath: 09/17/2024    1st Mrg lesion is 100% stenosed.   A drug-eluting stent was successfully placed using a STENT SYNERGY XD 3.0X16.   Post intervention, there is a 0% residual stenosis.   The left ventricular systolic function is normal.   LV end diastolic pressure is mildly elevated.   The left ventricular ejection fraction is 55-65% by visual estimate.   Recommend uninterrupted dual antiplatelet therapy with Aspirin 81mg  daily and Ticagrelor 90mg  twice daily for a minimum of 12 months (ACS-Class I recommendation).   Single vessel occlusive CAD involving a large first OM Normal LV function Mildly elevated LVEDP reduced from 26 to 19 post intervention Normal lactate 1.3 Successful PCI of the first OM with DES   Plan: DAPT for one year. Anticipate fast track DC   Diagnostic Dominance: Right  Intervention   Echo: 09/17/2024  IMPRESSIONS     1. Left ventricular ejection fraction, by estimation, is 55 to 60%. Left  ventricular ejection fraction by 2D MOD biplane is 64.7 %. The left  ventricle has normal function. The left ventricle has no regional wall  motion abnormalities. Left ventricular  diastolic parameters were normal. The average left ventricular global  longitudinal strain is -18.5 %. The global longitudinal strain is normal.   2. Right ventricular systolic function is normal. The right ventricular  size  is normal.   3. The mitral valve is normal in structure. No evidence of mitral valve  regurgitation. No evidence of mitral stenosis.   4. The aortic valve is normal in structure. Aortic valve regurgitation is  not visualized. No aortic stenosis is present.   5. The inferior vena cava is dilated in size with <50% respiratory  variability, suggesting right atrial pressure of 15 mmHg.   Comparison(s): No prior Echocardiogram.   FINDINGS   Left Ventricle: Left ventricular ejection fraction, by estimation, is 55  to 60%. Left ventricular ejection fraction by 2D MOD biplane is 64.7 %.  The left ventricle has normal function. The left ventricle has no regional  wall motion abnormalities. The  average left ventricular global longitudinal strain is -18.5 %. Strain was  performed and the global longitudinal strain is normal. The global  longitudinal strain is normal despite suboptimal segment tracking. The  left ventricular internal cavity size was   normal in size. There is no left ventricular hypertrophy. Left  ventricular diastolic parameters were normal.   Right Ventricle: The right ventricular size is normal. No increase in  right ventricular wall thickness. Right ventricular systolic function is  normal.   Left Atrium: Left atrial size was normal in size.   Right Atrium: Right atrial size was normal in size.   Pericardium: There is no evidence of pericardial effusion.   Mitral Valve: The mitral valve is normal in structure. No evidence of  mitral valve regurgitation. No evidence of mitral valve stenosis.   Tricuspid Valve: The tricuspid valve is normal in structure. Tricuspid  valve regurgitation is  not demonstrated. No evidence of tricuspid  stenosis.   Aortic Valve: The aortic valve is normal in structure. Aortic valve  regurgitation is not visualized. No aortic stenosis is present.   Pulmonic Valve: The pulmonic valve was normal in structure. Pulmonic valve  regurgitation is  not visualized. No evidence of pulmonic stenosis.   Aorta: The aortic root is normal in size and structure.   Venous: The inferior vena cava is dilated in size with less than 50%  respiratory variability, suggesting right atrial pressure of 15 mmHg.   IAS/Shunts: No atrial level shunt detected by color flow Doppler.  _____________   History of Present Illness   Dalton Bradley is a 63 y.o. male with PMH of OSA on Cpap, DDD, GERD and anxiety who is being seen 09/17/2024 for the evaluation of chest pain with abnormal EKG who initially presented to [facility].  He has never seen cardiology in the past. Previous lipid panel 01/2024 notable for LDL 139,  HDL 45. Reports he had some chest pain last evening prior to bed. Woke up this morning with recurrent lef t sided chest pain, clammy and diaphoretic. Initially thought it may have been anxiety but symptoms persisted and his wife brought him to the ED at [facility].    In the ED labs showed Na+ 137, K+ 3.5, Cr 1.09, hsTn 60, WBC 5.3, Hgb 16.8, lactic acid 3.3. EKG @0609  showed sinus rhythm 62 bpm J point elevation in lead II, III and v6 with hyper acute T waves. Repeat EKG @ 0614 similar. Code STEMI called, but cancelled by Dr. Ladona as EKG did not meet criteria for STEMI. Given patients concerning symptoms, decision made to bring to the cath lab for urgent case.    Hospital Course    STEMI -- presented with chest pain EKG notable to lateral STEMI. Underwent cardiac cath 12/3 with single vessel CAD of large 1st OM treated with PCI/DES x1. Recommendations for DAPT with ASA/Brilinta for at least one year. hsTn peaked > 24000.  -- echo 12/3 LVEF 55-60%, no rWMA, normal RV and no significant valvular disease  -- continue ASA, Brilinta, crestor 40mg  daily, Toprol 25mg  daily   HLD -- LDL 136, HDL 38 -- continue Crestor 40mg  daily -- FLP/LFTs in 8 weeks   GERD -- protonix 40mg  daily   PVCs -- continue Toprol XL 25mg  daily   Patient was seen by Dr. Wendel and  deemed stable for discharge home. Follow up arranged in the office (prefers [place])  Did the patient have an acute coronary syndrome (MI, NSTEMI, STEMI, etc) this admission?:  Yes                               AHA/ACC ACS Clinical Performance & Quality Measures: Aspirin prescribed? - Yes ADP Receptor Inhibitor (Plavix/Clopidogrel, Brilinta/Ticagrelor or Effient/Prasugrel) prescribed (includes medically managed patients)? - Yes Beta Blocker prescribed? - Yes High Intensity Statin (Lipitor 40-80mg  or Crestor 20-40mg ) prescribed? - Yes EF assessed during THIS hospitalization? - Yes For EF <40%, was ACEI/ARB prescribed? - Not Applicable (EF >/= 40%) For EF <40%, Aldosterone Antagonist (Spironolactone or Eplerenone) prescribed? - Not Applicable (EF >/= 40%) Cardiac Rehab Phase II ordered (including medically managed patients)? - Yes       The patient will be scheduled for a TOC follow up appointment in 10-14 days.  A message has been sent to the [facility] and Scheduling Pool at the office where the patient  should be seen for follow up.  _____________  Discharge Vitals Blood pressure 128/87, pulse 80, temperature 98.2 F (36.8 C), temperature source Oral, resp. rate 17, height 6' 3 (1.905 m), weight 117.9 kg, SpO2 98%.  Filed Weights   09/17/24 0620 09/17/24 0620  Weight: 117.9 kg 117.9 kg    Labs & Radiologic Studies  CBC Recent Labs    09/17/24 0618 09/18/24 0225  WBC 5.3 8.5  NEUTROABS 3.1  --   HGB 16.8 16.5  HCT 48.6 49.1  MCV 94.4 93.7  PLT 215 203   Basic Metabolic Panel Recent Labs    87/96/74 0618 09/18/24 0225  NA 137 136  K 3.5 4.2  CL 99 102  CO2 25 25  GLUCOSE 142* 105*  BUN 10 10  CREATININE 1.09 1.15  CALCIUM 8.9 8.9   Liver Function Tests Recent Labs    09/17/24 0618  AST 28  ALT 36  ALKPHOS 95  BILITOT 0.6  PROT 7.1  ALBUMIN 4.2   No results for input(s): LIPASE, AMYLASE in the last 72 hours. High Sensitivity Troponin:   Recent  Labs  Lab 09/17/24 1019  TROPONINIHS >24,000*    Recent Labs  Lab 09/17/24 0618  TRNPT 60*    BNP Invalid input(s): POCBNP No results for input(s): PROBNP in the last 72 hours.  No results for input(s): BNP in the last 72 hours.  D-Dimer No results for input(s): DDIMER in the last 72 hours. Hemoglobin A1C Recent Labs    09/17/24 0618  HGBA1C 5.3   Fasting Lipid Panel Recent Labs    09/17/24 0618  CHOL 198  HDL 38*  LDLCALC 136*  TRIG 121  CHOLHDL 5.2   No results found for: LIPOA  Thyroid Function Tests No results for input(s): TSH, T4TOTAL, T3FREE, THYROIDAB in the last 72 hours.  Invalid input(s): FREET3 _____________  ECHOCARDIOGRAM COMPLETE Result Date: 09/17/2024    ECHOCARDIOGRAM REPORT   Patient Name:   Dalton Bradley Date of Exam: 09/17/2024 Medical Rec #:  [id number]    Height:       75.0 in Accession #:    [id number]   Weight:       260.0 lb Date of Birth:  05-21-1961     BSA:          2.453 m Patient Age:    63 years     BP:           101/48 mmHg Patient Gender: M            HR:           64 bpm. Exam Location:  Inpatient Procedure: 2D Echo (Both Spectral and Color Flow Doppler were utilized during            procedure). Indications:    STEMI  History:        Patient has no prior history of Echocardiogram examinations.                 Risk Factors:Sleep Apnea.  Sonographer:    Tinnie Barefoot RDCS Referring Phys: [phone] PETER M JORDAN  Sonographer Comments: Global longitudinal strain was attempted. IMPRESSIONS  1. Left ventricular ejection fraction, by estimation, is 55 to 60%. Left ventricular ejection fraction by 2D MOD biplane is 64.7 %. The left ventricle has normal function. The left ventricle has no regional wall motion abnormalities. Left ventricular diastolic parameters were normal. The average left ventricular global longitudinal strain is -18.5 %. The global longitudinal strain  is normal.  2. Right ventricular systolic function is normal. The  right ventricular size is normal.  3. The mitral valve is normal in structure. No evidence of mitral valve regurgitation. No evidence of mitral stenosis.  4. The aortic valve is normal in structure. Aortic valve regurgitation is not visualized. No aortic stenosis is present.  5. The inferior vena cava is dilated in size with <50% respiratory variability, suggesting right atrial pressure of 15 mmHg. Comparison(s): No prior Echocardiogram. FINDINGS  Left Ventricle: Left ventricular ejection fraction, by estimation, is 55 to 60%. Left ventricular ejection fraction by 2D MOD biplane is 64.7 %. The left ventricle has normal function. The left ventricle has no regional wall motion abnormalities. The average left ventricular global longitudinal strain is -18.5 %. Strain was performed and the global longitudinal strain is normal. The global longitudinal strain is normal despite suboptimal segment tracking. The left ventricular internal cavity size was  normal in size. There is no left ventricular hypertrophy. Left ventricular diastolic parameters were normal. Right Ventricle: The right ventricular size is normal. No increase in right ventricular wall thickness. Right ventricular systolic function is normal. Left Atrium: Left atrial size was normal in size. Right Atrium: Right atrial size was normal in size. Pericardium: There is no evidence of pericardial effusion. Mitral Valve: The mitral valve is normal in structure. No evidence of mitral valve regurgitation. No evidence of mitral valve stenosis. Tricuspid Valve: The tricuspid valve is normal in structure. Tricuspid valve regurgitation is not demonstrated. No evidence of tricuspid stenosis. Aortic Valve: The aortic valve is normal in structure. Aortic valve regurgitation is not visualized. No aortic stenosis is present. Pulmonic Valve: The pulmonic valve was normal in structure. Pulmonic valve regurgitation is not visualized. No evidence of pulmonic stenosis. Aorta: The  aortic root is normal in size and structure. Venous: The inferior vena cava is dilated in size with less than 50% respiratory variability, suggesting right atrial pressure of 15 mmHg. IAS/Shunts: No atrial level shunt detected by color flow Doppler.  LEFT VENTRICLE PLAX 2D                        Biplane EF (MOD) LVIDd:         5.10 cm         LV Biplane EF:   Left LVIDs:         3.00 cm                          ventricular LV PW:         1.10 cm                          ejection LV IVS:        0.80 cm                          fraction by LVOT diam:     2.00 cm                          2D MOD LV SV:         93                               biplane is LV SV Index:   38  64.7 %. LVOT Area:     3.14 cm LV IVRT:       63 msec         Diastology                                LV e' medial:    7.62 cm/s                                LV E/e' medial:  10.8 LV Volumes (MOD)               LV e' lateral:   10.90 cm/s LV vol d, MOD    95.3 ml       LV E/e' lateral: 7.6 A2C: LV vol d, MOD    73.4 ml       2D Longitudinal A4C:                           Strain LV vol s, MOD    34.8 ml       2D Strain GLS   -16.5 % A2C:                           (A4C): LV vol s, MOD    24.8 ml       2D Strain GLS   -14.5 % A4C:                           (A3C): LV SV MOD A2C:   60.5 ml       2D Strain GLS   -24.5 % LV SV MOD A4C:   73.4 ml       (A2C): LV SV MOD BP:    54.6 ml       2D Strain GLS   -18.5 %                                Avg: RIGHT VENTRICLE             IVC RV Basal diam:  3.10 cm     IVC diam: 2.20 cm RV S prime:     12.30 cm/s TAPSE (M-mode): 1.6 cm LEFT ATRIUM             Index        RIGHT ATRIUM           Index LA diam:        3.90 cm 1.59 cm/m   RA Area:     16.30 cm LA Vol (A2C):   41.2 ml 16.79 ml/m  RA Volume:   47.50 ml  19.36 ml/m LA Vol (A4C):   54.9 ml 22.38 ml/m LA Biplane Vol: 50.9 ml 20.75 ml/m  AORTIC VALVE LVOT Vmax:   142.00 cm/s LVOT Vmean:  87.900 cm/s LVOT VTI:    0.296 m   AORTA Ao Root diam: 3.10 cm Ao Asc diam:  3.00 cm MITRAL VALVE MV Area (PHT): 3.21 cm    SHUNTS MV Decel Time: 236 msec    Systemic VTI:  0.30 m MV E velocity: 82.50 cm/s  Systemic Diam: 2.00 cm MV A velocity: 82.50 cm/s MV E/A ratio:  1.00 Franck Azobou Tonleu  Electronically signed by Joelle Ren Ny Signature Date/Time: 09/17/2024/11:28:19 AM    Final    CARDIAC CATHETERIZATION Result Date: 09/17/2024   1st Mrg lesion is 100% stenosed.   A drug-eluting stent was successfully placed using a STENT SYNERGY XD 3.0X16.   Post intervention, there is a 0% residual stenosis.   The left ventricular systolic function is normal.   LV end diastolic pressure is mildly elevated.   The left ventricular ejection fraction is 55-65% by visual estimate.   Recommend uninterrupted dual antiplatelet therapy with Aspirin 81mg  daily and Ticagrelor 90mg  twice daily for a minimum of 12 months (ACS-Class I recommendation). Single vessel occlusive CAD involving a large first OM Normal LV function Mildly elevated LVEDP reduced from 26 to 19 post intervention Normal lactate 1.3 Successful PCI of the first OM with DES Plan: DAPT for one year. Anticipate fast track DC   DG Chest Port 1 View Result Date: 09/17/2024 EXAM: 1 VIEW(S) XRAY OF THE CHEST 09/17/2024 06:39:42 AM COMPARISON: Portable chest x ray 08/06/2022. CLINICAL HISTORY: 63 year old male with STEMI, chest pain . FINDINGS: LINES, TUBES AND DEVICES: Overlying defibrillator pad. LUNGS AND PLEURA: No focal pulmonary opacity. No pleural effusion. No pneumothorax. HEART AND MEDIASTINUM: No acute abnormality of the cardiac and mediastinal silhouettes. BONES AND SOFT TISSUES: No acute osseous abnormality. IMPRESSION: 1. No acute cardiopulmonary abnormality. Electronically signed by: Helayne Hurst MD 09/17/2024 06:45 AM EST RP Workstation: [id number]    Disposition Pt is being discharged home today in good condition.  Follow-up Plans & Appointments  Discharge Instructions      Amb Referral to Cardiac Rehabilitation   Complete by: As directed    Diagnosis:  STEMI Coronary Stents     After initial evaluation and assessments completed: Virtual Based Care may be provided alone or in conjunction with Phase 2 Cardiac Rehab based on patient barriers.: Yes   Intensive Cardiac Rehabilitation (ICR) [facility] location only OR Traditional Cardiac Rehabilitation (TCR) *If criteria for ICR are not met will enroll in TCR [facility] only): Yes   Call MD for:  difficulty breathing, headache or visual disturbances   Complete by: As directed    Call MD for:  persistant dizziness or light-headedness   Complete by: As directed    Call MD for:  redness, tenderness, or signs of infection (pain, swelling, redness, odor or green/yellow discharge around incision site)   Complete by: As directed    Diet - low sodium heart healthy   Complete by: As directed    Discharge instructions   Complete by: As directed    Radial Site Care Refer to this sheet in the next few weeks. These instructions provide you with information on caring for yourself after your procedure. Your caregiver may also give you more specific instructions. Your treatment has been planned according to current medical practices, but problems sometimes occur. Call your caregiver if you have any problems or questions after your procedure. HOME CARE INSTRUCTIONS You may shower the day after the procedure. Remove the bandage (dressing) and gently wash the site with plain soap and water . Gently pat the site dry.  Do not apply powder or lotion to the site.  Do not submerge the affected site in water  for 3 to 5 days.  Inspect the site at least twice daily.  Do not flex or bend the affected arm for 24 hours.  No lifting over 5 pounds (2.3 kg) for 5 days after your procedure.  Do not drive home if you are  discharged the same day of the procedure. Have someone else drive you.  You may drive 24 hours after the procedure unless otherwise  instructed by your caregiver.  What to expect: Any bruising will usually fade within 1 to 2 weeks.  Blood that collects in the tissue (hematoma) may be painful to the touch. It should usually decrease in size and tenderness within 1 to 2 weeks.  SEEK IMMEDIATE MEDICAL CARE IF: You have unusual pain at the radial site.  You have redness, warmth, swelling, or pain at the radial site.  You have drainage (other than a small amount of blood on the dressing).  You have chills.  You have a fever or persistent symptoms for more than 72 hours.  You have a fever and your symptoms suddenly get worse.  Your arm becomes pale, cool, tingly, or numb.  You have heavy bleeding from the site. Hold pressure on the site.   PLEASE DO NOT MISS ANY DOSES OF YOUR BRILINTA !!!!! Also keep a log of you blood pressures and bring back to your follow up appt. Please call the office with any questions.   Patients taking blood thinners should generally stay away from medicines like ibuprofen , Advil , Motrin , naproxen , and Aleve  due to risk of stomach bleeding. You may take Tylenol  as directed or talk to your primary doctor about alternatives.  PLEASE ENSURE THAT YOU DO NOT RUN OUT OF YOUR BRILINTA . This medication is very important to remain on for at least one year. IF you have issues obtaining this medication due to cost please CALL the office 3-5 business days prior to running out in order to prevent missing doses of this medication.   Increase activity slowly   Complete by: As directed        Discharge Medications Allergies as of 09/18/2024       Reactions   Other Hives, Itching   exotic woods        Medication List     TAKE these medications    ALPRAZolam  0.5 MG tablet Commonly known as: Xanax  Take 1 tablet (0.5 mg total) by mouth 2 (two) times daily as needed for anxiety or sleep. What changed: when to take this   aspirin  EC 81 MG tablet Take 1 tablet (81 mg total) by mouth daily. Swallow  whole. Start taking on: September 19, 2024   metoprolol  succinate 25 MG 24 hr tablet Commonly known as: TOPROL -XL Take 1 tablet (25 mg total) by mouth daily. Start taking on: September 19, 2024   nitroGLYCERIN  0.4 MG SL tablet Commonly known as: NITROSTAT  Place 1 tablet (0.4 mg total) under the tongue every 5 (five) minutes x 3 doses as needed for chest pain.   pantoprazole  40 MG tablet Commonly known as: PROTONIX  Take 1 tablet (40 mg total) by mouth daily. Start taking on: September 19, 2024   rosuvastatin  40 MG tablet Commonly known as: CRESTOR  Take 1 tablet (40 mg total) by mouth daily. Start taking on: September 19, 2024   ticagrelor  90 MG Tabs tablet Commonly known as: BRILINTA  Take 1 tablet (90 mg total) by mouth 2 (two) times daily.         Outstanding Labs/Studies  FLP/LFTs in 8 weeks   Duration of Discharge Encounter: APP Time: 20 minutes   Signed, Manuelita Rummer, NP 09/18/2024, 11:44 AM  ATTENDING ATTESTATION:  After conducting a review of all available clinical information with the care team, interviewing the patient, and performing a physical exam, I agree with the findings  and plan described in this note.   GEN: No acute distress, AO x 3 HEENT:  MMM, no JVD, no scleral icterus Cardiac: RRR, no murmurs, rubs, or gallops.  Respiratory: Clear to auscultation bilaterally. GI: Soft, nontender, non-distended  MS: No edema; No deformity. Neuro:  Nonfocal  Vasc:  +2 radial pulses   Patient is doing well after uncomplicated PCI of obtuse marginal.  I spent 22 minutes reviewing the results of his PCI procedure, the need for medical therapy, the duration of medical therapy, the need for medical compliance, and the need for cardiac rehabilitation.  Plan as below.   Acute coronary syndrome/lateral STEMI Continue aspirin 81 mg, ticagrelor 90 mg twice daily,  Toprol 25 mg daily, rosuvastatin with dose adjustment as below, as needed nitroglycerin TTE yesterday with  preserved ejection fraction no significant valvular abnormalities Hyperlipidemia Increase rosuvastatin to 40 mg daily GERD History of esophageal dilatation previously Start Protonix 40 mg daily PVCs/AIVR Continue Toprol 25 mg Disposition Discharge later today with close hospital follow-up.  Patient would like to follow-up in [place].  APP discharge time:20 MD discharge time:22  Lurena Red, MD Pager [phone]

## 2024-09-18 NOTE — Telephone Encounter (Signed)
 Patient contacted by clinical team regarding discharge from hospital on September 18, 2024.  Patient understands to follow up with provider Laymon Qua, PA on September 30, 2024 at 9:30 at [facility]. Patient understands discharge instructions? Yes Patient understands medications and how to take them? Yes Patient understands to bring all medications to this visit? Yes.  Patient had no further questions or concerns regarding discharge/appointment at this time.

## 2024-09-18 NOTE — Telephone Encounter (Signed)
 Pharmacy Patient Advocate Encounter  Insurance verification completed.    The patient is insured through [employer]. Patient has [employer], may use a copay card, and/or apply for patient assistance if available.    Ran test claim for Brilinta 90mg  tablet and the current 30 day co-pay is $38.13.   This test claim was processed through [facility]- copay amounts may vary at other pharmacies due to pharmacy/plan contracts, or as the patient moves through the different stages of their insurance plan.

## 2024-09-19 LAB — LIPOPROTEIN A (LPA): Lipoprotein (a): <8.4 nmol/L (ref <75.0)

## 2024-09-28 NOTE — Progress Notes (Unsigned)
 Cardiology Office Note    Date:  09/30/2024  ID:  Dalton Bradley, DOB 1961/03/23, MRN [id number] Cardiologist: Peter Jordan, MD Cardiology APP:  Johnson Laymon HERO, PA-C { :  History of Present Illness:    Dalton Bradley is a 63 y.o. male with past medical history of OSA, DDD and GERD who presents to the office today for follow-up from his recent STEMI.  He was recently admitted to [facility] from 12/3 - 09/18/2024 after EKG upon arrival to [facility] ED had shown significant ST elevation. EKG was not felt to meet STEMI criteria but given his presentation and EKG changes, he underwent urgent cardiac catheterization. This showed single-vessel disease of a large OM1 which was treated with DES x 1. Was started on DAPT with ASA and Brilinta . Was also started on Toprol -XL 25 mg daily and Crestor  40 mg daily (LDL 136 during admission). Echocardiogram showed a preserved EF of 55 to 60% with normal RV function and no significant valve abnormalities.  In talking with the patient and his wife today, he reports having occasional episodes of brief, fleeting chest pain which last for less than a minute and spontaneously resolve. He is anxious when this occurs as he is unsure if symptoms are due to his history of acid reflux or due to a cardiac etiology. He has made significant dietary changes since his hospitalization as they were previously consuming fast food regularly. Has lost over 8 pounds since his hospital stay due to dietary changes. He denies any specific orthopnea, PND or pitting edema. Blood pressure has been well-controlled when checked at home but heart rate has been in the 40's to 50's. Does report intermittent fatigue at times.  Studies Reviewed:   EKG: EKG is ordered today and demonstrates:   EKG Interpretation Date/Time:  Tuesday September 30 2024 08:43:32 EST Ventricular Rate:  52 PR Interval:  178 QRS Duration:  102 QT Interval:  404 QTC Calculation: 375 R Axis:   -39  Text  Interpretation: Sinus bradycardia Left axis deviation TWI along Lead III Confirmed by Johnson Laymon ([id number]) on 09/30/2024 8:48:12 AM       Echocardiogram: 09/2024 IMPRESSIONS     1. Left ventricular ejection fraction, by estimation, is 55 to 60%. Left  ventricular ejection fraction by 2D MOD biplane is 64.7 %. The left  ventricle has normal function. The left ventricle has no regional wall  motion abnormalities. Left ventricular  diastolic parameters were normal. The average left ventricular global  longitudinal strain is -18.5 %. The global longitudinal strain is normal.   2. Right ventricular systolic function is normal. The right ventricular  size is normal.   3. The mitral valve is normal in structure. No evidence of mitral valve  regurgitation. No evidence of mitral stenosis.   4. The aortic valve is normal in structure. Aortic valve regurgitation is  not visualized. No aortic stenosis is present.   5. The inferior vena cava is dilated in size with <50% respiratory  variability, suggesting right atrial pressure of 15 mmHg.   Comparison(s): No prior Echocardiogram.   Cardiac Catheterization: 09/2024   1st Mrg lesion is 100% stenosed.   A drug-eluting stent was successfully placed using a STENT SYNERGY XD 3.0X16.   Post intervention, there is a 0% residual stenosis.   The left ventricular systolic function is normal.   LV end diastolic pressure is mildly elevated.   The left ventricular ejection fraction is 55-65% by visual estimate.   Recommend uninterrupted  dual antiplatelet therapy with Aspirin  81mg  daily and Ticagrelor  90mg  twice daily for a minimum of 12 months (ACS-Class I recommendation).   Single vessel occlusive CAD involving a large first OM Normal LV function Mildly elevated LVEDP reduced from 26 to 19 post intervention Normal lactate 1.3 Successful PCI of the first OM with DES   Plan: DAPT for one year. Anticipate fast track DC    Physical Exam:   VS:   BP 118/84   Pulse (!) 52   Ht 6' 3 (1.905 m)   Wt 251 lb (113.9 kg)   SpO2 99%   BMI 31.37 kg/m    Wt Readings from Last 3 Encounters:  09/30/24 251 lb (113.9 kg)  09/17/24 260 lb (117.9 kg)  12/28/23 256 lb (116.1 kg)     GEN: Well nourished, well developed male appearing in no acute distress NECK: No JVD; No carotid bruits CARDIAC: RRR, no murmurs, rubs, gallops RESPIRATORY:  Clear to auscultation without rales, wheezing or rhonchi  ABDOMEN: Appears non-distended. No obvious abdominal masses. EXTREMITIES: No clubbing or cyanosis. No pitting edema.  Distal pedal pulses are 2+ bilaterally. Radial cath site with ecchymosis but no evidence of a hematoma.   Assessment and Plan:   1. ST elevation myocardial infarction involving left circumflex coronary artery (HCC) - He was recently admitted for a STEMI and found to have single-vessel occlusive disease involving a large first OM and underwent PCI with DES x 1. Was started on DAPT with ASA and Brilinta . - He has overall been progressing well and we reviewed that if he has occasional episodes of chest pain and he is unsure of what the etiology is, he could try taking a Tums and if symptoms persist, then utilize SL NTG. If having to take more than 3 SL NTG, would recommend ED evaluation.  - Continue ASA 81 mg daily, Brilinta  90 mg twice daily and Crestor  40 mg daily. Will reduce Toprol -XL to 12.5 mg daily. He is interested in participating in cardiac rehab and is able to start this from our perspective.  2. PVC (premature ventricular contraction) - He was noted to have PVC's on telemetry during admission and was started on Toprol -XL 25 mg daily. He has experienced bradycardia with heart rate in the high 40's to low 50's when checked at home. Will reduce Toprol -XL to 12.5 mg daily and have him report back on HR/BP readings in a few weeks. If still having bradycardia, will need to stop Toprol -XL as this is likely contributing to fatigue.  3.  Hyperlipidemia LDL goal <55 - FLP during recent admission showed total cholesterol 198, triglycerides 121, HDL 38 and LDL 136. LP(a) was normal at less than 8.4. He was started on Crestor  40 mg daily. Will plan for repeat FLP and LFT's in 2 months.    Cardiac Rehabilitation Eligibility Assessment  The patient is ready to start cardiac rehabilitation from a cardiac standpoint.        Signed, Laymon CHRISTELLA Qua, PA-C

## 2024-09-30 ENCOUNTER — Encounter: Payer: Self-pay | Admitting: Student

## 2024-09-30 ENCOUNTER — Ambulatory Visit: Admitting: Student

## 2024-09-30 VITALS — BP 118/84 | HR 52 | Ht 75.0 in | Wt 251.0 lb

## 2024-09-30 DIAGNOSIS — I493 Ventricular premature depolarization: Secondary | ICD-10-CM

## 2024-09-30 DIAGNOSIS — E785 Hyperlipidemia, unspecified: Secondary | ICD-10-CM

## 2024-09-30 DIAGNOSIS — I2121 ST elevation (STEMI) myocardial infarction involving left circumflex coronary artery: Secondary | ICD-10-CM

## 2024-09-30 MED ORDER — TICAGRELOR 90 MG PO TABS: 90.0000 mg | ORAL_TABLET | Freq: Two times a day (BID) | ORAL | 2 refills | Status: AC

## 2024-09-30 MED ORDER — ROSUVASTATIN CALCIUM 40 MG PO TABS: 40.0000 mg | ORAL_TABLET | Freq: Every day | ORAL | 1 refills | Status: AC

## 2024-09-30 MED ORDER — PANTOPRAZOLE SODIUM 40 MG PO TBEC: 40.0000 mg | DELAYED_RELEASE_TABLET | Freq: Every day | ORAL | 1 refills | Status: AC

## 2024-09-30 MED ORDER — METOPROLOL SUCCINATE ER 25 MG PO TB24: 12.5000 mg | ORAL_TABLET | Freq: Every day | ORAL | 3 refills | Status: AC

## 2024-09-30 NOTE — Patient Instructions (Signed)
 Medication Instructions:   Decrease Toprol  XL to 12.5 mg Daily   *If you need a refill on your cardiac medications before your next appointment, please call your pharmacy*  Lab Work: Your physician recommends that you return for lab work. Fasting   If you have labs (blood work) drawn today and your tests are completely normal, you will receive your results only by: MyChart Message (if you have MyChart) OR A paper copy in the mail If you have any lab test that is abnormal or we need to change your treatment, we will call you to review the results.  Testing/Procedures: NONE   Follow-Up: At [facility], you and your health needs are our priority.  As part of our continuing mission to provide you with exceptional heart care, our providers are all part of one team.  This team includes your primary Cardiologist (physician) and Advanced Practice Providers or APPs (Physician Assistants and Nurse Practitioners) who all work together to provide you with the care you need, when you need it.  Your next appointment:   3 month(s)  Provider:   Laymon Qua, PA-C    We recommend signing up for the patient portal called MyChart.  Sign up information is provided on this After Visit Summary.  MyChart is used to connect with patients for Virtual Visits (Telemedicine).  Patients are able to view lab/test results, encounter notes, upcoming appointments, etc.  Non-urgent messages can be sent to your provider as well.   To learn more about what you can do with MyChart, go to [email or web address].   Other Instructions Thank you for choosing [facility]!

## 2025-01-02 ENCOUNTER — Ambulatory Visit: Admitting: Student

## 2025-03-10 ENCOUNTER — Telehealth: Admitting: Adult Health
